# Patient Record
Sex: Female | Born: 1960 | Race: Asian | Hispanic: No | Marital: Married | Smoking: Never smoker
Health system: Southern US, Community
[De-identification: ages and names within clinical notes are randomized; demographics above are authoritative.]

## PROBLEM LIST (undated history)

## (undated) DIAGNOSIS — M5030 Other cervical disc degeneration, unspecified cervical region: Secondary | ICD-10-CM

## (undated) DIAGNOSIS — M5137 Other intervertebral disc degeneration, lumbosacral region: Secondary | ICD-10-CM

## (undated) DIAGNOSIS — M26609 Unspecified temporomandibular joint disorder, unspecified side: Secondary | ICD-10-CM

## (undated) DIAGNOSIS — G43909 Migraine, unspecified, not intractable, without status migrainosus: Secondary | ICD-10-CM

## (undated) DIAGNOSIS — IMO0001 Reserved for inherently not codable concepts without codable children: Secondary | ICD-10-CM

## (undated) DIAGNOSIS — G43919 Migraine, unspecified, intractable, without status migrainosus: Secondary | ICD-10-CM

## (undated) DIAGNOSIS — G8929 Other chronic pain: Secondary | ICD-10-CM

## (undated) DIAGNOSIS — R768 Other specified abnormal immunological findings in serum: Secondary | ICD-10-CM

## (undated) DIAGNOSIS — M2550 Pain in unspecified joint: Secondary | ICD-10-CM

## (undated) DIAGNOSIS — M5136 Other intervertebral disc degeneration, lumbar region: Secondary | ICD-10-CM

## (undated) DIAGNOSIS — G43709 Chronic migraine without aura, not intractable, without status migrainosus: Secondary | ICD-10-CM

## (undated) DIAGNOSIS — L309 Dermatitis, unspecified: Secondary | ICD-10-CM

## (undated) DIAGNOSIS — R9389 Abnormal findings on diagnostic imaging of other specified body structures: Secondary | ICD-10-CM

## (undated) DIAGNOSIS — Z8742 Personal history of other diseases of the female genital tract: Secondary | ICD-10-CM

## (undated) DIAGNOSIS — G4700 Insomnia, unspecified: Secondary | ICD-10-CM

## (undated) DIAGNOSIS — D649 Anemia, unspecified: Secondary | ICD-10-CM

## (undated) DIAGNOSIS — R87619 Unspecified abnormal cytological findings in specimens from cervix uteri: Secondary | ICD-10-CM

## (undated) DIAGNOSIS — N950 Postmenopausal bleeding: Secondary | ICD-10-CM

## (undated) DIAGNOSIS — M1990 Unspecified osteoarthritis, unspecified site: Secondary | ICD-10-CM

## (undated) DIAGNOSIS — M797 Fibromyalgia: Secondary | ICD-10-CM

## (undated) DIAGNOSIS — E039 Hypothyroidism, unspecified: Secondary | ICD-10-CM

## (undated) DIAGNOSIS — K5909 Other constipation: Secondary | ICD-10-CM

## (undated) HISTORY — DX: Anemia, unspecified: D64.9

## (undated) HISTORY — DX: Unspecified abnormal cytological findings in specimens from cervix uteri: R87.619

## (undated) HISTORY — PX: BREAST ENHANCEMENT SURGERY: SHX7

## (undated) HISTORY — PX: TUBAL LIGATION: SHX77

## (undated) HISTORY — PX: ANTERIOR FUSION CERVICAL SPINE: SUR626

## (undated) HISTORY — DX: Hypothyroidism, unspecified: E03.9

## (undated) HISTORY — DX: Other intervertebral disc degeneration, lumbosacral region: M51.37

## (undated) HISTORY — DX: Migraine, unspecified, not intractable, without status migrainosus: G43.909

## (undated) HISTORY — DX: Reserved for inherently not codable concepts without codable children: IMO0001

## (undated) HISTORY — PX: EMMI , ANTERIOR CERVICAL DISCECTOMY AND FUSION: 9988856

## (undated) HISTORY — DX: Unspecified temporomandibular joint disorder, unspecified side: M26.609

## (undated) HISTORY — DX: Migraine, unspecified, intractable, without status migrainosus: G43.919

## (undated) HISTORY — DX: Other cervical disc degeneration, unspecified cervical region: M50.30

## (undated) MED ORDER — BOTOX 200 UNITS IJ SOLR
INTRAMUSCULAR | Status: AC
Start: 2016-08-14 — End: ?

## (undated) MED ORDER — BOTULINUM TOXIN TYPE A 100 UNIT IM SOLR
100.00 [IU] | Freq: Once | INTRAMUSCULAR | Status: AC
Start: 2014-07-20 — End: 2014-07-20

---

## 2005-03-19 ENCOUNTER — Emergency Department (HOSPITAL_COMMUNITY): Admission: EM | Admit: 2005-03-19 | Discharge: 2005-03-20 | Payer: Self-pay | Admitting: Emergency Medicine

## 2006-04-10 ENCOUNTER — Emergency Department (HOSPITAL_COMMUNITY): Admission: EM | Admit: 2006-04-10 | Discharge: 2006-04-11 | Payer: Self-pay | Admitting: Emergency Medicine

## 2006-04-16 ENCOUNTER — Ambulatory Visit (HOSPITAL_BASED_OUTPATIENT_CLINIC_OR_DEPARTMENT_OTHER): Admission: RE | Admit: 2006-04-16 | Discharge: 2006-04-16 | Payer: Self-pay | Admitting: Plastic Surgery

## 2006-04-16 ENCOUNTER — Encounter (INDEPENDENT_AMBULATORY_CARE_PROVIDER_SITE_OTHER): Payer: Self-pay | Admitting: Specialist

## 2006-06-22 ENCOUNTER — Encounter: Admission: RE | Admit: 2006-06-22 | Discharge: 2006-06-22 | Payer: Self-pay | Admitting: Orthopedic Surgery

## 2006-07-08 ENCOUNTER — Emergency Department (HOSPITAL_COMMUNITY): Admission: EM | Admit: 2006-07-08 | Discharge: 2006-07-08 | Payer: Self-pay | Admitting: Emergency Medicine

## 2007-08-18 ENCOUNTER — Encounter: Admission: RE | Admit: 2007-08-18 | Discharge: 2007-08-18 | Payer: Self-pay | Admitting: Neurological Surgery

## 2007-12-04 ENCOUNTER — Encounter: Admission: RE | Admit: 2007-12-04 | Discharge: 2007-12-04 | Payer: Self-pay | Admitting: Gastroenterology

## 2008-07-07 IMAGING — US US ABDOMEN COMPLETE
1 series · 14 of 25 positions shown · non-contrast
Comparison: None

CLINICAL DATA: Epigastric pain

ABDOMEN ULTRASOUND
TECHNIQUE: Complete abdominal ultrasound examination was performed
including evaluation of the liver, gallbladder, bile ducts,
pancreas, kidneys, spleen, IVC, and abdominal aorta.

[Series 1: us abdomen complete · 0.26mm/px · 14 of 83 slices shown]
[im 1/83]
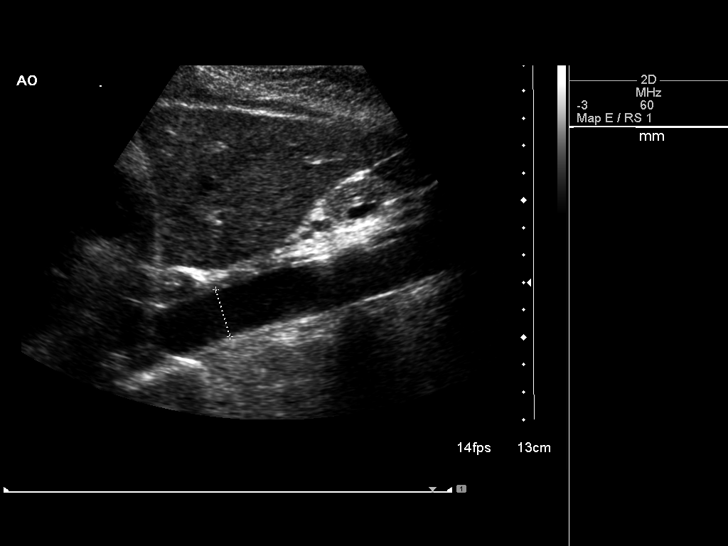
[im 7/83]
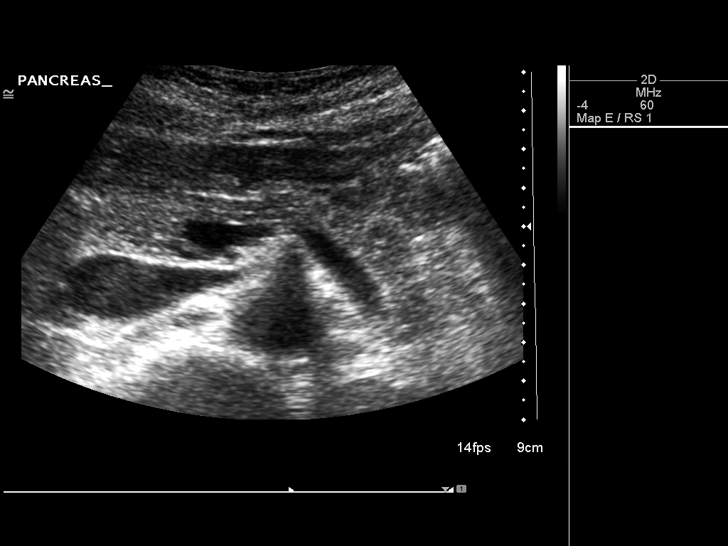
[im 14/83]
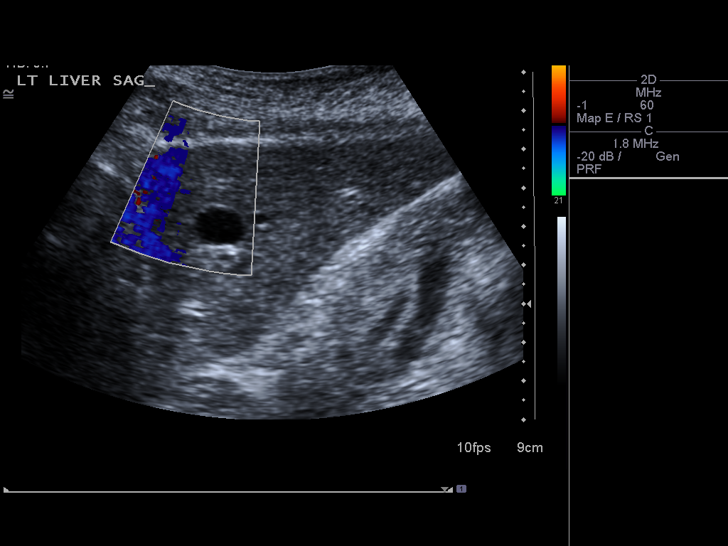
[im 21/83]
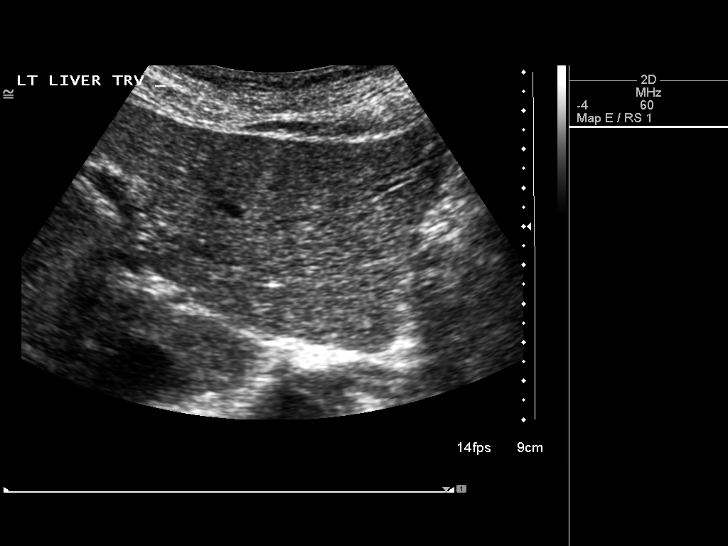
[im 28/83]
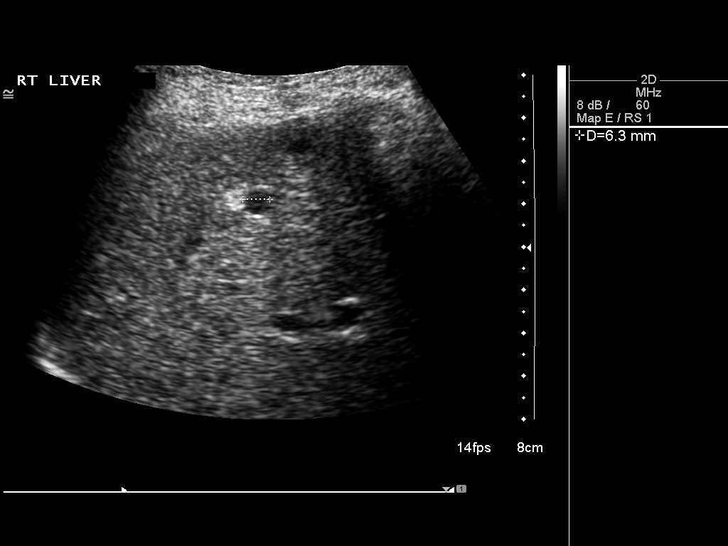
[im 31/83]
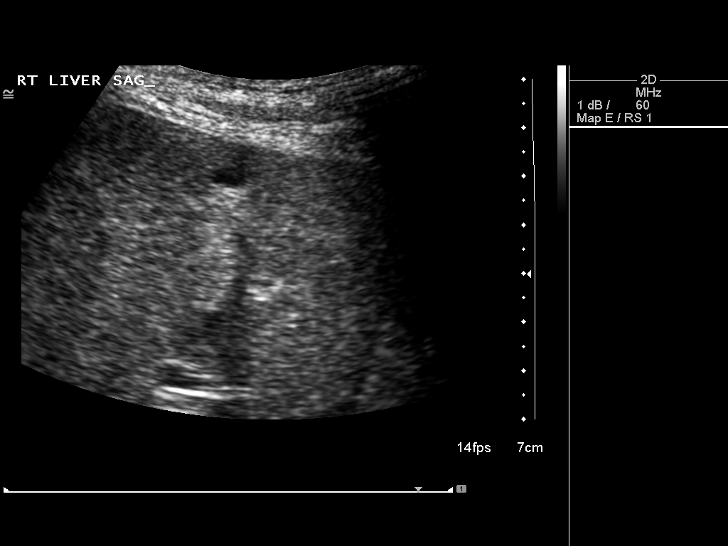
[im 38/83]
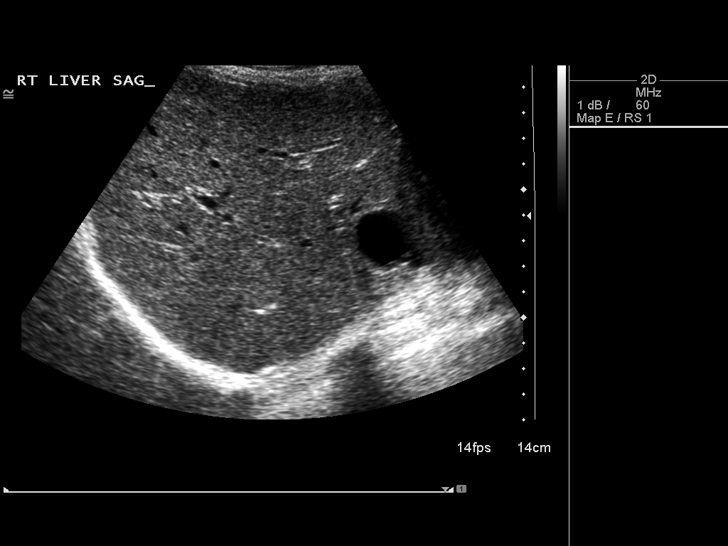
[im 45/83]
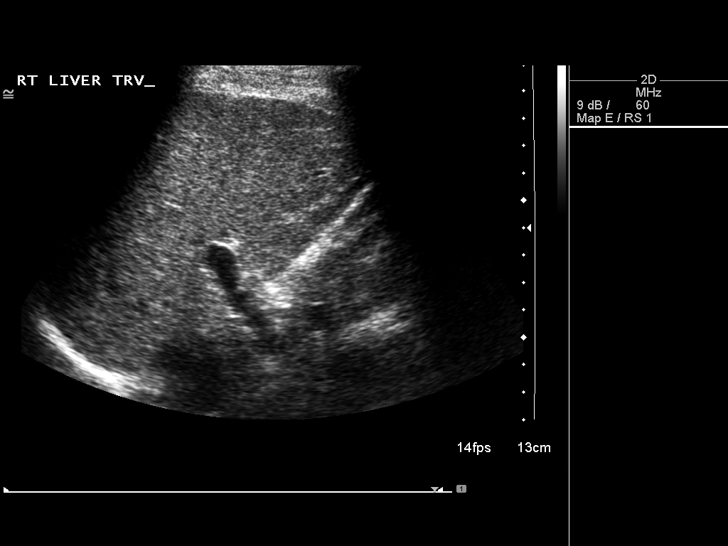
[im 52/83]
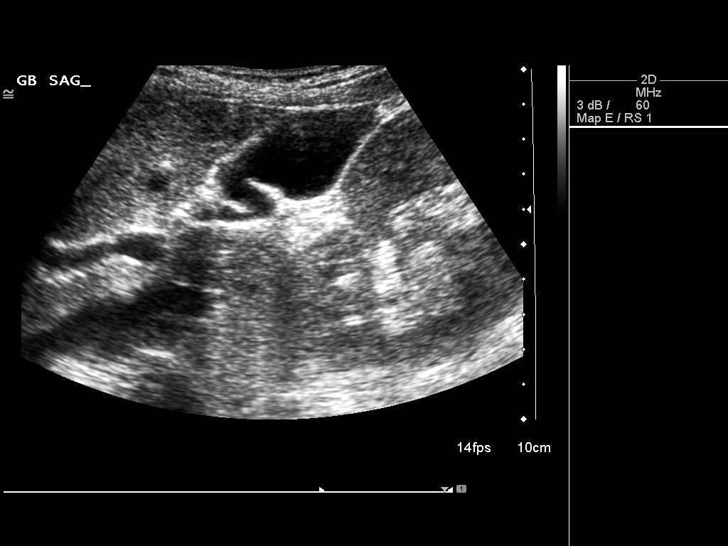
[im 55/83]
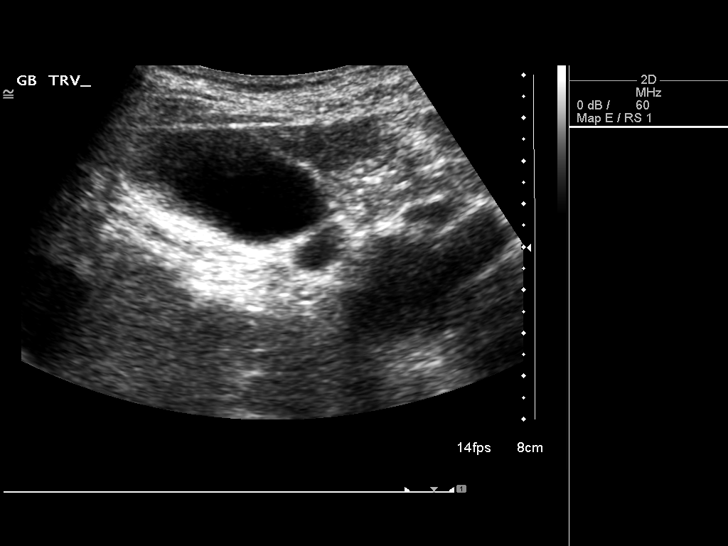
[im 62/83]
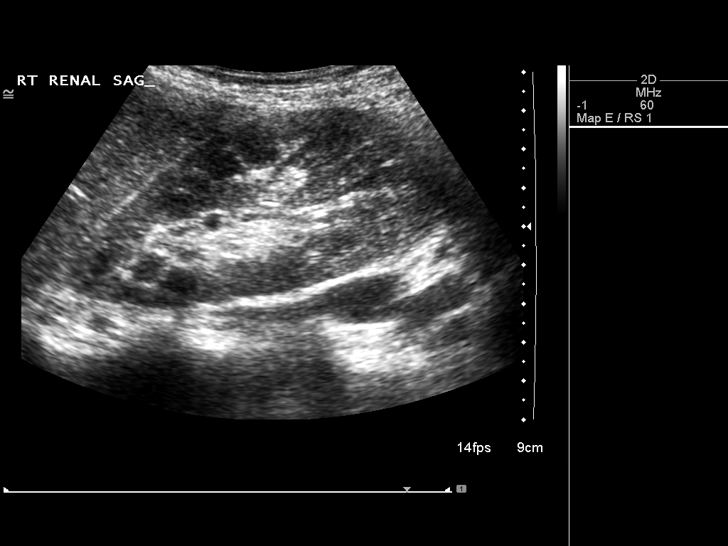
[im 69/83]
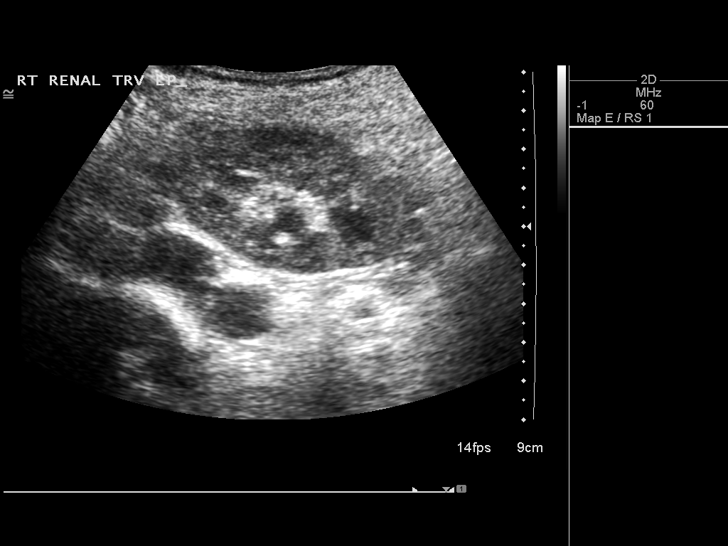
[im 76/83]
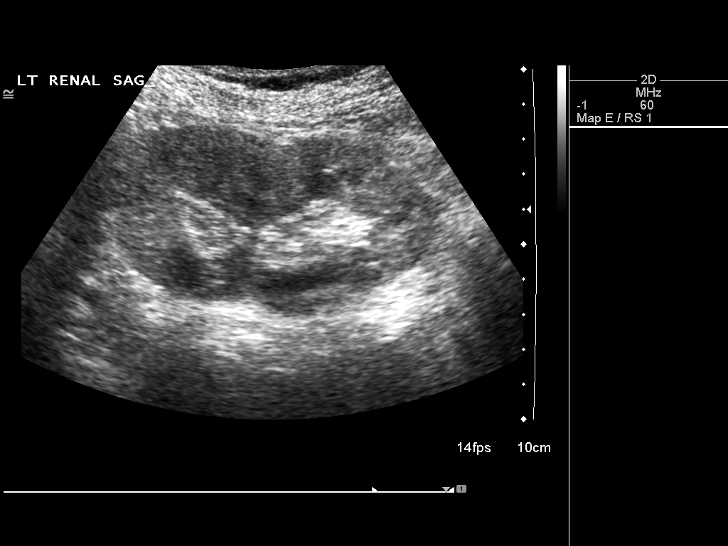
[im 83/83]
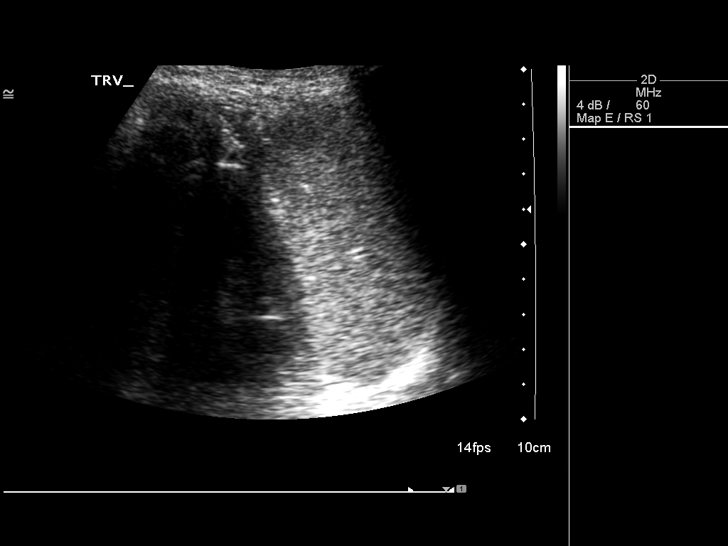

[14 of 25 positions shown; findings below may reference images not displayed]

FINDINGS: The gallbladder is well seen and no gallstones are noted.
The liver has a normal echogenic pattern, but there are multiple
liver cysts present. There are least seven liver cysts noted, with
five in the right lobe and two in the left lobe.  The largest cyst
is in the right lobe measuring 3.3 cm in diameter.  No solid
hepatic lesion is seen and no ductal dilatation is noted.  The
common bile duct is normal measuring 3.5 mm in diameter.  The IVC,
pancreas, and spleen appear normal.  No hydronephrosis is noted.
The right kidney measures 9.6 cm sagittally with left kidney
measuring 10.7 cm.  The abdominal aorta is normal in caliber.
IMPRESSION: 1.  No gallstones.  No ductal dilatation.
2.  Multiple liver cysts as noted above.

## 2009-05-19 HISTORY — PX: ANTERIOR CERVICAL DECOMP/DISCECTOMY FUSION: SHX1161

## 2009-08-20 HISTORY — PX: MICROLARYNGOSCOPY: SHX5208

## 2010-08-14 ENCOUNTER — Encounter: Payer: Self-pay | Admitting: Orthopedic Surgery

## 2010-08-14 ENCOUNTER — Encounter: Payer: Self-pay | Admitting: Family Medicine

## 2010-10-10 ENCOUNTER — Other Ambulatory Visit: Payer: Self-pay | Admitting: Obstetrics and Gynecology

## 2010-11-02 ENCOUNTER — Other Ambulatory Visit: Payer: Self-pay | Admitting: Obstetrics and Gynecology

## 2010-12-09 NOTE — Op Note (Signed)
[name], Kaitlin Moses              ACCOUNT NO.:  [id number]   MEDICAL RECORD NO.:  [id number]          PATIENT TYPE:  AMB   LOCATION:  [facility]                          FACILITY:  [facility]   PHYSICIAN:  Alfredia Ferguson, M.D.  DATE OF BIRTH:  February 16, 1961   DATE OF PROCEDURE:  04/16/2006  DATE OF DISCHARGE:                                 OPERATIVE REPORT   PREOPERATIVE DIAGNOSIS:  A 1 cm pigmented nevus right pretibial area and mid  leg.   POSTOPERATIVE DIAGNOSIS:  A 1 cm pigmented nevus right pretibial area and  mid leg.   OPERATION PERFORMED:  Excision of pretibial pigmented nevus with  approximately 2 mm margins.   SURGEON:  Alfredia Ferguson, M.D.   ANESTHESIA:  2% Xylocaine with 1:100,000 epinephrine.   INDICATIONS FOR SURGERY:  50 year old [place] female with a darkly pigmented  nevus approximately 1 cm in diameter.  The patient understands that excision  of this area is going to leave a scar due to the tension of the closure.  In  spite of that, she wishes to proceed.  She was offered the opportunity of  simply doing a biopsy to see what the lesion represented, but she opted to  proceed with excision.   DESCRIPTION OF SURGERY:  Skin marks were placed in an elliptical fashion and  then local anesthesia was infiltrated.  The right pretibial area was prepped  with Betadine and draped with sterile drapes.  An elliptical excision down  to the subcutaneous tissue was carried out.  The specimen was submitted for  pathology.  The wound edges were undermined for a distance of several mm in  all directions.  The wound was closed reapproximating the dermis with  interrupted 4-0 Monocryl sutures.  The skin was united with interrupted 5-0  nylon sutures.  The patient tolerated the procedure well with minimal blood  loss.  The area was cleansed and dried and a light dressing was applied.  The patient was discharged home in satisfactory addition.      Alfredia Ferguson, M.D.  Electronically  Signed     WBB/MEDQ  D:  04/16/2006  T:  04/17/2006  Job:  [id number]

## 2011-01-11 ENCOUNTER — Encounter: Payer: Self-pay | Admitting: Oncology

## 2011-01-13 ENCOUNTER — Other Ambulatory Visit: Payer: Self-pay | Admitting: Oncology

## 2011-01-13 ENCOUNTER — Encounter (HOSPITAL_BASED_OUTPATIENT_CLINIC_OR_DEPARTMENT_OTHER): Payer: BC Managed Care – PPO | Admitting: Oncology

## 2011-01-13 DIAGNOSIS — E039 Hypothyroidism, unspecified: Secondary | ICD-10-CM

## 2011-01-13 DIAGNOSIS — D649 Anemia, unspecified: Secondary | ICD-10-CM

## 2011-01-13 LAB — CBC WITH DIFFERENTIAL/PLATELET
BASO%: 0.6 % (ref 0.0–2.0)
Basophils Absolute: 0 10*3/uL (ref 0.0–0.1)
EOS%: 2.9 % (ref 0.0–7.0)
Eosinophils Absolute: 0.2 10*3/uL (ref 0.0–0.5)
HCT: 32.1 % — ABNORMAL LOW (ref 34.8–46.6)
HGB: 10.9 g/dL — ABNORMAL LOW (ref 11.6–15.9)
LYMPH%: 28.1 % (ref 14.0–49.7)
MCH: 31.9 pg (ref 25.1–34.0)
MCHC: 33.9 g/dL (ref 31.5–36.0)
MCV: 94.1 fL (ref 79.5–101.0)
MONO#: 0.5 10*3/uL (ref 0.1–0.9)
MONO%: 8.6 % (ref 0.0–14.0)
NEUT#: 3.5 10*3/uL (ref 1.5–6.5)
NEUT%: 59.8 % (ref 38.4–76.8)
Platelets: 204 10*3/uL (ref 145–400)
RBC: 3.41 10*6/uL — ABNORMAL LOW (ref 3.70–5.45)
RDW: 14.2 % (ref 11.2–14.5)
WBC: 5.8 10*3/uL (ref 3.9–10.3)
lymph#: 1.6 10*3/uL (ref 0.9–3.3)

## 2011-01-13 LAB — MORPHOLOGY: PLT EST: ADEQUATE

## 2011-01-13 LAB — CHCC SMEAR

## 2011-01-17 LAB — PROTEIN ELECTROPHORESIS, SERUM
Albumin ELP: 62.5 % (ref 55.8–66.1)
Alpha-1-Globulin: 3.8 % (ref 2.9–4.9)
Alpha-2-Globulin: 9.1 % (ref 7.1–11.8)
Beta 2: 4.5 % (ref 3.2–6.5)
Beta Globulin: 5.7 % (ref 4.7–7.2)
Gamma Globulin: 14.4 % (ref 11.1–18.8)
Total Protein, Serum Electrophoresis: 6.1 g/dL (ref 6.0–8.3)

## 2011-01-17 LAB — FERRITIN: Ferritin: 23 ng/mL (ref 10–291)

## 2011-01-17 LAB — IRON AND TIBC
%SAT: 23 % (ref 20–55)
Iron: 77 ug/dL (ref 42–145)
TIBC: 328 ug/dL (ref 250–470)
UIBC: 251 ug/dL

## 2011-01-17 LAB — VITAMIN B12: Vitamin B-12: 813 pg/mL (ref 211–911)

## 2011-01-17 LAB — COMPREHENSIVE METABOLIC PANEL
ALT: 13 U/L (ref 0–35)
AST: 20 U/L (ref 0–37)
Albumin: 4.1 g/dL (ref 3.5–5.2)
Alkaline Phosphatase: 33 U/L — ABNORMAL LOW (ref 39–117)
BUN: 13 mg/dL (ref 6–23)
CO2: 27 mEq/L (ref 19–32)
Calcium: 9.2 mg/dL (ref 8.4–10.5)
Chloride: 103 mEq/L (ref 96–112)
Creatinine, Ser: 0.79 mg/dL (ref 0.50–1.10)
Glucose, Bld: 61 mg/dL — ABNORMAL LOW (ref 70–99)
Potassium: 4.1 mEq/L (ref 3.5–5.3)
Sodium: 137 mEq/L (ref 135–145)
Total Bilirubin: 0.6 mg/dL (ref 0.3–1.2)
Total Protein: 6.1 g/dL (ref 6.0–8.3)

## 2011-01-17 LAB — LACTATE DEHYDROGENASE: LDH: 131 U/L (ref 94–250)

## 2011-01-17 LAB — T4, FREE: Free T4: 1.12 ng/dL (ref 0.80–1.80)

## 2011-01-17 LAB — TSH: TSH: 5.176 u[IU]/mL — ABNORMAL HIGH (ref 0.350–4.500)

## 2011-01-17 LAB — HAPTOGLOBIN: Haptoglobin: 83 mg/dL (ref 30–200)

## 2011-01-17 LAB — FOLATE: Folate: >20 ng/mL

## 2011-06-29 ENCOUNTER — Encounter: Payer: Self-pay | Admitting: *Deleted

## 2011-07-02 ENCOUNTER — Encounter: Payer: Self-pay | Admitting: Oncology

## 2011-07-02 DIAGNOSIS — E039 Hypothyroidism, unspecified: Secondary | ICD-10-CM | POA: Insufficient documentation

## 2011-07-02 DIAGNOSIS — D649 Anemia, unspecified: Secondary | ICD-10-CM | POA: Insufficient documentation

## 2011-07-10 ENCOUNTER — Ambulatory Visit: Payer: BC Managed Care – PPO | Admitting: Oncology

## 2011-07-10 ENCOUNTER — Other Ambulatory Visit: Payer: BC Managed Care – PPO | Admitting: Lab

## 2011-08-19 ENCOUNTER — Telehealth: Payer: Self-pay | Admitting: Oncology

## 2011-08-19 NOTE — Telephone Encounter (Signed)
l/m and mailed appt info from 01/13/11 pof   aom

## 2011-10-17 ENCOUNTER — Other Ambulatory Visit: Payer: BC Managed Care – PPO | Admitting: Lab

## 2012-01-29 ENCOUNTER — Other Ambulatory Visit: Payer: BC Managed Care – PPO

## 2012-01-29 ENCOUNTER — Ambulatory Visit: Payer: BC Managed Care – PPO | Admitting: Oncology

## 2012-04-11 ENCOUNTER — Other Ambulatory Visit: Payer: Self-pay | Admitting: Obstetrics and Gynecology

## 2013-07-14 ENCOUNTER — Ambulatory Visit (INDEPENDENT_AMBULATORY_CARE_PROVIDER_SITE_OTHER): Payer: Commercial Managed Care - PPO | Admitting: Neurology

## 2013-07-14 ENCOUNTER — Encounter (INDEPENDENT_AMBULATORY_CARE_PROVIDER_SITE_OTHER): Payer: Self-pay | Admitting: Neurology

## 2013-07-14 ENCOUNTER — Encounter (INDEPENDENT_AMBULATORY_CARE_PROVIDER_SITE_OTHER): Payer: Commercial Managed Care - PPO | Admitting: Neurology

## 2013-07-14 VITALS — BP 107/72 | HR 60 | Temp 97.5°F | Resp 18

## 2013-07-14 DIAGNOSIS — G43819 Other migraine, intractable, without status migrainosus: Secondary | ICD-10-CM

## 2013-07-14 DIAGNOSIS — G43919 Migraine, unspecified, intractable, without status migrainosus: Secondary | ICD-10-CM | POA: Insufficient documentation

## 2013-07-14 DIAGNOSIS — M26609 Unspecified temporomandibular joint disorder, unspecified side: Secondary | ICD-10-CM

## 2013-07-14 MED ORDER — ZOLPIDEM TARTRATE 5 MG OR TABS
5.00 mg | ORAL_TABLET | Freq: Every evening | ORAL | Status: DC | PRN
Start: ? — End: 2015-08-23

## 2013-07-14 MED ORDER — BOTULINUM TOXIN TYPE A 100 UNIT IM SOLR
100.00 [IU] | Freq: Once | INTRAMUSCULAR | Status: AC
Start: 2013-07-14 — End: 2013-07-14

## 2013-07-14 MED ORDER — HYDROCODONE-ACETAMINOPHEN 10-325 MG OR TABS
1.00 | ORAL_TABLET | Freq: Four times a day (QID) | ORAL | Status: DC | PRN
Start: ? — End: 2015-08-23

## 2013-07-14 NOTE — Addendum Note (Signed)
 Addended by: Kiarrah Rausch on: 07/14/2013 04:31 PM     Modules accepted: Level of Service

## 2013-07-14 NOTE — Progress Notes (Signed)
 BOTULINUM TOXIN INJECTION VISIT    NAME:  Kathy Mckenzie  DOB:  03-Nov-1960  MRN:  [id number]  DATE OF EVALUATION:  07/14/2013    REASON FOR VISIT:  Botulinum toxin injection(s)    HISTORY:  Kathy Mckenzie is a 52 year old-year old female with chronic migraine last seen by me in my previous clinic in [place] having received Botox  on 10/2.14.   Botulinum toxin therapy has improved her headaches significantly. Of note, she has also noted gluten free diet has helped.     OUTCOMES  BENEFITS ONSET OF EFFECT ADVERSE EFFECTS Type:   Onset 1-2 weeks Onset Drooping left brow   Maximal Effect 6 Maximal Effect    Total Duration 8 Total Duration 4 weeks   % Improvement >50%       CURRENT MEDICATIONS  Current Outpatient Prescriptions   Medication Sig   . HYDROcodone -acetaminophen  (NORCO) 10-325 MG tablet Take 1 tablet by mouth every 6 hours as needed for Moderate Pain (Pain Score 4-6).   . zolpidem  (AMBIEN ) 5 MG tablet Take 5 mg by mouth nightly as needed for Insomnia.     No current facility-administered medications for this visit.       NEUROLOGICAL EXAMINATION  Vitals:  BP 107/72  Pulse 60  Temp(Src) 97.5 F (36.4 C) (Oral)  Resp 18  SpO2 98%  LMP 06/30/2013    PERRL< normal facial symmetry and conversational hearing.   No dysarthria.   Normal bulk and tone, strength 5/5  DTRs 2+ BUE and BLE  Sensory normal to all modalities   Normal FFM  Normal gait    BOTULINUM TOXIN PROCEDURE NOTE      Areas to be injected were prepped with isopropyl alcohol.      INJECTION LOG   MUSCLE SIDE DILUTION #SITES UNITS PRIOR DOSE   1. Procerus - 2:1 1 5 5    2. Corrugators B  1 each 2.5 2.5   3. Frontalis B  4 2.5 each 5 each   4. Temporalis B  1 each 5 each 10 each   5. Supra-auricular B  1 each 5 each 5   6. Base of skull B  1 each 10 each 10   7. CxPS B  1 each 5 each 5   8. Trapezius B  1 each 5 each 5      TOTAL DOSE: 100 WASTE: 0   Also       Orb oculi lat inf      B                          1 each   1.25 each     None       Masseter                     B                           1 each    5 each        None    Toxin Brand Utilized: Botox  [employer] Toxin Supply Used.    The patient tolerated the procedure well with no adverse effects.    Lot No: [id number]  Exp:  March 2017      ASSESSMENT AND PLAN  1.  Chronic Refractory migraine  - Based on the signs and symptoms consistent with this disorder, botulinum toxin therapy is necessary  and consistent with generally accepted professional standards within the medical literature and provided at safe and effective dosages.    2. Temporomandibular joint disease - patient reports pain in B masseters.    RTC in 12 weeks.     TOTAL TIME SPENT WITH PATIENT: 

## 2013-07-21 ENCOUNTER — Encounter (INDEPENDENT_AMBULATORY_CARE_PROVIDER_SITE_OTHER): Payer: Medicare PPO | Admitting: Neurology

## 2013-10-08 ENCOUNTER — Ambulatory Visit (INDEPENDENT_AMBULATORY_CARE_PROVIDER_SITE_OTHER): Payer: BC Managed Care – PPO | Admitting: Neurology

## 2013-10-08 ENCOUNTER — Encounter (INDEPENDENT_AMBULATORY_CARE_PROVIDER_SITE_OTHER): Payer: Self-pay | Admitting: Neurology

## 2013-10-08 VITALS — BP 86/51 | HR 61 | Temp 97.6°F | Resp 17

## 2013-10-08 DIAGNOSIS — M26609 Unspecified temporomandibular joint disorder, unspecified side: Secondary | ICD-10-CM

## 2013-10-08 DIAGNOSIS — G43719 Chronic migraine without aura, intractable, without status migrainosus: Secondary | ICD-10-CM

## 2013-10-08 MED ORDER — GABAPENTIN PO
ORAL | Status: DC
Start: ? — End: 2015-08-23

## 2013-10-08 MED ORDER — BOTULINUM TOXIN TYPE A 100 UNIT IM SOLR
100.0000 [IU] | Freq: Once | INTRAMUSCULAR | Status: AC
Start: 2013-10-08 — End: 2013-10-09

## 2013-10-08 MED ORDER — BOTOX IJ
100.00 [IU] | INTRAMUSCULAR | Status: DC
Start: ? — End: 2015-08-23

## 2013-10-08 MED ORDER — DIAZEPAM PO
ORAL | Status: DC
Start: ? — End: 2015-08-23

## 2013-10-08 MED ORDER — LEVOTHYROXINE SODIUM 50 MCG OR TABS: 50.00 ug | ORAL_TABLET | Freq: Every day | ORAL | Status: AC

## 2013-10-08 NOTE — Progress Notes (Signed)
BOTULINUM TOXIN INJECTION VISIT    NAME:  Kathy Mckenzie  DOB:  04/18/1961  MRN:  [id number]  DATE OF EVALUATION:  10/08/2013    REASON FOR VISIT:  Botulinum toxin injection(s)    HISTORY:  Kathy HugeKyung Perrone is a 53 year old-year old female with chronic migraines last seen 07/14/13.  She is still having jaw pain that radiates into the neck.     OUTCOMES  BENEFITS ONSET OF EFFECT ADVERSE EFFECTS Type:     Onset 1 week Onset    Maximal Effect 1.5-2wks Maximal Effect    Total Duration 10weeks Total Duration    % Improvement 70-75%       CURRENT MEDICATIONS  Current Outpatient Prescriptions   Medication Sig    DIAZEPAM PO     GABAPENTIN PO     HYDROcodone-acetaminophen (NORCO) 10-325 MG tablet Take 1 tablet by mouth every 6 hours as needed for Moderate Pain (Pain Score 4-6).    levothyroxine (SYNTHROID) 50 MCG tablet Take 50 mcg by mouth daily.    OnabotulinumtoxinA (BOTOX IJ)     zolpidem (AMBIEN) 5 MG tablet Take 5 mg by mouth nightly as needed for Insomnia.     No current facility-administered medications for this visit.       NEUROLOGICAL EXAMINATION  Vitals:  BP 86/51    Pulse 61    Temp(Src) 97.6 F (36.4 C) (Oral)    Resp 17    SpO2 96%     No facial drooping.   Normal speech    BOTULINUM TOXIN PROCEDURE NOTE      Areas to be injected were prepped with isopropyl alcohol.      INJECTION LOG   MUSCLE  SIDE  DILUTION  #SITES  UNITS  PRIOR DOSE    1.  Procerus  -  2:1  1  5  5     2.  Corrugators  B   1 each  2.5  2.5    3.  Frontalis  B   4  2.5 each  5 each    4.  Temporalis  B   1 each  5 each  10 each    5.  Supra-auricular  B   1 each  5 each  5    6.  Base of skull  B   1 each  5 each  10    7.  CxPS  B   1 each  5 each  5    8.  Trapezius  B   1 each  5 each  5       TOTAL  DOSE:  100  WASTE: 0    Also  Orb oculi lat inf    B    1 each 1.25 each  None  Masseter    B    1 each 10 each  None  Posterior mastoid         B                                1           3.75 units None    Toxin Brand Utilized:   Botox  Core Toxin Supply Used.    Lot No: [id number]  Exp Date: March 2017    The patient tolerated the procedure well with no adverse effects.    ASSESSMENT AND PLAN  1. Chronic Refractory migraine  - Based  on the signs and symptoms consistent with this disorder, botulinum toxin therapy is necessary and consistent with generally accepted professional standards within the medical literature and provided at safe and effective dosages.   2. Temporomandibular joint disease - patient reports pain in B masseters.    RTC in 12 weeks.

## 2014-01-08 ENCOUNTER — Encounter (INDEPENDENT_AMBULATORY_CARE_PROVIDER_SITE_OTHER): Payer: BC Managed Care – PPO | Admitting: Neurology

## 2014-01-22 ENCOUNTER — Encounter (INDEPENDENT_AMBULATORY_CARE_PROVIDER_SITE_OTHER): Payer: Self-pay | Admitting: Neurology

## 2014-01-22 ENCOUNTER — Ambulatory Visit (INDEPENDENT_AMBULATORY_CARE_PROVIDER_SITE_OTHER): Payer: BC Managed Care – PPO | Admitting: Neurology

## 2014-01-22 VITALS — BP 92/51 | HR 67 | Temp 98.2°F | Resp 17 | Ht 63.0 in | Wt 120.0 lb

## 2014-01-22 DIAGNOSIS — G43719 Chronic migraine without aura, intractable, without status migrainosus: Secondary | ICD-10-CM

## 2014-01-22 MED ORDER — POLYETHYLENE GLYCOL 3350 OR POWD
ORAL | Status: DC
Start: 2014-01-20 — End: 2015-08-23

## 2014-01-22 MED ORDER — DIAZEPAM 5 MG OR TABS
ORAL_TABLET | ORAL | Status: DC
Start: 2014-01-05 — End: 2015-08-23

## 2014-01-22 MED ORDER — GABAPENTIN 300 MG OR CAPS
300.00 mg | ORAL_CAPSULE | Freq: Three times a day (TID) | ORAL | Status: DC
Start: 2014-01-05 — End: 2016-09-25

## 2014-01-22 NOTE — Progress Notes (Signed)
BOTULINUM TOXIN INJECTION VISIT    NAME:  Kathy Mckenzie Lusher  DOB:  03/15/1961  MRN:  [id number]  DATE OF EVALUATION:  01/22/2014    REASON FOR VISIT:  Botulinum toxin injection(s)    HISTORY:  Kathy Mckenzie Galli is a 53 year old-year old female with migraines    Effects started to taper off 3 weeks ago. Feeling worse over the past week. She is two weeks overdue due to my vacation.   The masseter injections helped with the jaw pain.     OUTCOMES  BENEFITS ONSET OF EFFECT ADVERSE EFFECTS Type:     Onset 1week Onset    Maximal Effect 1.5-2wks Maximal Effect    Total Duration 10-11weeks Total Duration    % Improvement        CURRENT MEDICATIONS  Current Outpatient Prescriptions   Medication Sig    diazepam (VALIUM) 5 MG tablet     DIAZEPAM PO     gabapentin (NEURONTIN) 300 MG capsule     GABAPENTIN PO     HYDROcodone-acetaminophen (NORCO) 10-325 MG tablet Take 1 tablet by mouth every 6 hours as needed for Moderate Pain (Pain Score 4-6).    levothyroxine (SYNTHROID) 50 MCG tablet Take 50 mcg by mouth daily.    OnabotulinumtoxinA (BOTOX IJ) Inject 100 Units under the skin Every 3 months.    polyethylene glycol (GLYCOLAX) powder     zolpidem (AMBIEN) 5 MG tablet Take 5 mg by mouth nightly as needed for Insomnia.     No current facility-administered medications for this visit.       NEUROLOGICAL EXAMINATION  Vitals:  BP 92/51   Pulse 67   Temp(Src) 98.2 F (36.8 C) (Oral)   Resp 17   Ht 5\' 3"  (1.6 m)   Wt 54.432 kg (120 lb)   BMI 21.26 kg/m2   SpO2 96%    No facial drooping.   Normal speech    BOTULINUM TOXIN PROCEDURE NOTE    Areas to be injected were prepped with isopropyl alcohol.    INJECTION LOG   MUSCLE   SIDE   DILUTION   #SITES   UNITS   PRIOR DOSE     1.  Procerus  -  2:1  1  5  5     2.  Corrugators  B   1 each  2.5  2.5    3.  Frontalis  B   4  2.5 each  5 each    4.  Temporalis  B   1 each  5 each  10 each    5.  Supra-auricular  B   1 each  5 each  5    6.  Base of skull  B   1 each  5 each  10    7.  CxPS  B   1  each  5 each  5    8.  Trapezius  B   1 each  5 each  5       TOTAL  DOSE:  100  WASTE: 0    Also  Orb oculi lat inf B     1 each 1.25 each 1.25 each  Masseter B      1 each 10 each    10  Posterior mastoid B     1  3.75 units 3.75    Toxin Brand Utilized: Botox [employer]oxin [employer] Used.    Lot No: [id number]  Exp Date: March 2017  The patient tolerated the procedure well with no adverse effects.    ASSESSMENT AND PLAN  1. Chronic Refractory migraine - Based on the signs and symptoms consistent with this disorder, botulinum toxin therapy is necessary and consistent with generally accepted professional standards within the medical literature and provided at safe and effective dosages.   2. Temporomandibular joint disease - patient reports pain in B masseters.        RTC in 12 weeks.

## 2014-02-06 MED ORDER — BOTULINUM TOXIN TYPE A 100 UNIT IM SOLR
100.00 [IU] | Freq: Once | INTRAMUSCULAR | Status: AC
Start: 2014-01-22 — End: 2014-01-23

## 2014-04-14 ENCOUNTER — Ambulatory Visit (INDEPENDENT_AMBULATORY_CARE_PROVIDER_SITE_OTHER): Payer: BC Managed Care – PPO | Admitting: Neurology

## 2014-04-14 VITALS — BP 111/68 | HR 69 | Resp 14

## 2014-04-14 DIAGNOSIS — G43909 Migraine, unspecified, not intractable, without status migrainosus: Principal | ICD-10-CM

## 2014-04-14 DIAGNOSIS — G43719 Chronic migraine without aura, intractable, without status migrainosus: Secondary | ICD-10-CM

## 2014-04-14 NOTE — Progress Notes (Signed)
-----(data below generated by Iver Nestle, MD)----    NEUROLOGY NOTE     Demographics:  Date: April 14, 2014   Patient Name: Kathy Mckenzie  Medical Record #: [id number]   DOB: 11/15/1960  Age: 53 year old  Sex: female      Clinic Location:      [facility]  [facility]  [address]  [place] [address]      Chief Complaint   Patient presents with    Headache     daily headaches,       History of Present Illness:       Kathy Mckenzie is a 53 year old female who is here for follow up. At that last visit, she had Botox injections.      She has been having HA for about one month. The headaches recurred about one month after the botox. However, they are much less severe, and she only needs Naprosyn about 1-2x/week. She has been having lots of back pain, that goes up to the shoulders and neck, to the back of the head and the bilateral jaw pain. She is pending ESI lumbar spine tomorrow.      Allergies:   Allergies   Allergen Reactions    Peanuts [Peanut Oil] Rash       Current Outpatient Prescriptions   Medication Sig    diazepam (VALIUM) 5 MG tablet     DIAZEPAM PO     gabapentin (NEURONTIN) 300 MG capsule 300 mg 2 times daily.    GABAPENTIN PO     HYDROcodone-acetaminophen (NORCO) 10-325 MG tablet Take 1 tablet by mouth every 6 hours as needed for Moderate Pain (Pain Score 4-6).    levothyroxine (SYNTHROID) 50 MCG tablet Take 50 mcg by mouth daily.    OnabotulinumtoxinA (BOTOX IJ) Inject 100 Units under the skin Every 3 months.    polyethylene glycol (GLYCOLAX) powder     zolpidem (AMBIEN) 5 MG tablet Take 5 mg by mouth nightly as needed for Insomnia.     No current facility-administered medications for this visit.       Past Medical History   Diagnosis Date    Refractory migraine     Temporomandibular joint disorders, unspecified     Myalgia and myositis, unspecified      Fibromyalgia    Headache(784.0)      Cervicogenic    Degeneration of cervical  intervertebral disc     Degeneration of lumbar or lumbosacral intervertebral disc        Past Surgical History   Procedure Laterality Date    Emmi , anterior cervical discectomy and fusion       C5-6       Family History:    Family History   Problem Relation Age of Onset    Family history unknown: Yes       History     Social History    Marital Status: Married     Spouse Name: N/A     Number of Children: N/A    Years of Education: N/A     Social History Main Topics    Smoking status: Never Smoker     Smokeless tobacco: Never Used    Alcohol Use: No    Drug Use: Not on file    Sexual Activity: Not on file     Other Topics Concern    Not on file     Social History Narrative  Other Social History (in addition to above): Companion (with patient): husband    Review of Systems: No fever/ chills/ nausea/ vomiting. No chest pain/ shortness of breath.    Exam:   On Exam today, I find:   Vital signs: Recorded in chart, and reviewed by me today.  BP 111/68 mmHg   Pulse 69   Resp 14   SpO2 94%     General Exam:  General: Patient is well developed, & in no acute distress.  Extremities: No clubbing, cyanosis, or edema.    Neuro Exam:  HIF: Oriented x 4. Intact memory. Appropriate mood & affect. Normal attention span & concentration. Normal language functions. Normal fund of knowledge. No neglect or inattention.   CN: Cranial Nerves II-XII intact throughout.   Motor: Normal bulk, tone and 5/5 strength in 4 extremities.   Sensory: Intact to all modalities.   DTR's: Symmetric throughout.   Cereb: Normal rapid alternating movements bilaterally.  Gait: Normal.    Labs/Imaging/ Studies:       RELEVANT RESULTS/ENCOUNTERS (in visit navigator) were reviewed: Yes  Radiographic film images were personally reviewed by me: N/A  Outside medical records were personally reviewed by me: N/A    Other Imaging Results: none      Areas to be injected were prepped with isopropyl alcohol.   INJECTION LOG   MUSCLE   SIDE   DILUTION   #SITES    UNITS   PRIOR DOSE     1.  Procerus  -  2:1  1  5  5     2.  Corrugators  B   1 each  2.5  2.5    3.  Frontalis  B   4  2.5 each  5 each    4.  Temporalis  B   1 each  5 each  10 each    5.  Supra-auricular  B   1 each  5 each  5    6.  Base of skull  B   1 each  5 each  10    7.  CxPS  B   1 each  5 each  5    8.  Trapezius  B   1 each  5 each  5       TOTAL  DOSE:  100  WASTE: 0    Also    Masseter B 1 each 10 each 10  Posterior mastoid B 1 5 units 3.75    Toxin Brand Utilized: Botox [employer]oxin [employer] Used.    Lot No: [id number]  Exp Date: March 2017        The patient tolerated the procedure well with no adverse effects.    ASSESSMENT AND PLAN  1. Chronic Refractory migraine - Based on the signs and symptoms consistent with this disorder, botulinum toxin therapy is necessary and consistent with generally accepted professional standards within the medical literature and provided at safe and effective dosages.   2. Temporomandibular joint disease - patient reports pain in B masseters.      Note Author: Iver Nestle, MD

## 2014-04-15 MED ORDER — BOTULINUM TOXIN TYPE A 100 UNIT IM SOLR
100.0000 [IU] | Freq: Once | INTRAMUSCULAR | Status: AC
Start: 2014-04-15 — End: 2014-04-14
  Administered 2014-04-14: 100 [IU] via INTRAMUSCULAR

## 2014-04-16 ENCOUNTER — Encounter (INDEPENDENT_AMBULATORY_CARE_PROVIDER_SITE_OTHER): Payer: BC Managed Care – PPO | Admitting: Neurology

## 2014-07-20 ENCOUNTER — Ambulatory Visit (INDEPENDENT_AMBULATORY_CARE_PROVIDER_SITE_OTHER): Payer: BC Managed Care – PPO | Admitting: Neurology

## 2014-07-20 VITALS — BP 86/65 | HR 84 | Temp 98.2°F | Resp 16 | Ht 63.0 in | Wt 120.0 lb

## 2014-07-20 DIAGNOSIS — G43719 Chronic migraine without aura, intractable, without status migrainosus: Secondary | ICD-10-CM

## 2014-07-20 MED ORDER — BOTULINUM TOXIN TYPE A 100 UNIT IM SOLR
100.00 [IU] | Freq: Once | INTRAMUSCULAR | Status: AC
Start: 2014-07-20 — End: 2014-07-20
  Administered 2014-07-20: 100 [IU] via INTRAMUSCULAR

## 2014-07-20 MED ORDER — RELISTOR 12 MG/0.6ML SC SOLN
SUBCUTANEOUS | Status: DC
Start: 2014-05-11 — End: 2015-08-23

## 2014-07-20 MED ORDER — VITAMIN D (ERGOCALCIFEROL) 1.25 MG (50000 UT) PO CAPS
ORAL_CAPSULE | ORAL | Status: DC
Start: 2014-04-23 — End: 2015-08-23

## 2014-07-20 NOTE — Progress Notes (Addendum)
BOTULINUM TOXIN INJECTION VISIT    NAME:  Kathy Mckenzie  DOB:  04/05/1961  MRN:  [id number]  DATE OF EVALUATION:  07/20/2014    REASON FOR VISIT:  Botulinum toxin injection(s)    HISTORY:  Kathy Mckenzie is a 53 year old-year old female with chronic migraine last seen 04/14/14. Botulinum toxin therapy has improved her headaches and quality of life significantly     Headaches restarted about 1.5 weeks ago.   In between, the headaches are well controlled.       CURRENT MEDICATIONS  Current Outpatient Prescriptions   Medication Sig    diazepam (VALIUM) 5 MG tablet     DIAZEPAM PO     ergocalciferol (VITAMIN D) 50000 UNIT capsule     gabapentin (NEURONTIN) 300 MG capsule 300 mg 2 times daily.    GABAPENTIN PO     HYDROcodone-acetaminophen (NORCO) 10-325 MG tablet Take 1 tablet by mouth every 6 hours as needed for Moderate Pain (Pain Score 4-6).    levothyroxine (SYNTHROID) 50 MCG tablet Take 50 mcg by mouth daily.    OnabotulinumtoxinA (BOTOX IJ) Inject 100 Units under the skin Every 3 months.    polyethylene glycol (GLYCOLAX) powder     RELISTOR 12 MG/0.6ML SOLN     zolpidem (AMBIEN) 5 MG tablet Take 5 mg by mouth nightly as needed for Insomnia.     Current Facility-Administered Medications   Medication    onabotulinumtoxin A (BOTOX) injection 100 Units       NEUROLOGICAL EXAMINATION  Vitals:  BP 86/65 mmHg   Pulse 84   Temp(Src) 98.2 F (36.8 C) (Oral)   Resp 16   Ht 5\' 3"  (1.6 m)   Wt 54.432 kg (120 lb)   BMI 21.26 kg/m2   SpO2 95%      Normal CN examination     BOTULINUM TOXIN PROCEDURE NOTE  Patient received Risk Evaluation and Mitigation Strategy Information and all questions were address.    Areas to be injected were prepped with isopropyl alcohol.   INJECTION LOG   MUSCLE   SIDE   DILUTION   #SITES   UNITS   PRIOR DOSE     1.  Procerus  -  2:1  1  5  5     2.  Corrugators  B   1 each  2.5  2.5    3.  Frontalis  B   4  2.5 each  5 each    4.  Temporalis  B   1 each  5 each  10 each    5.   Supra-auricular  B   1 each  5 each  5    6.  Base of skull  B   1 each  5 each  10    7.  CxPS  B   1 each  5 each  5    8.  Trapezius  B   1 each  5 each  5       TOTAL  DOSE:  100  WASTE: 0    Also    Masseter B 1 each 10 each 10  Posterior mastoid B 1 5 units 3.75    Toxin Brand Utilized: Botox Core Toxin Supply Used.         ASSESSMENT AND PLAN  Kathy Mckenzie was seen today for headache.    Diagnoses and associated orders for this visit:    Intractable chronic migraine without aura and without status migrainosus  - onabotulinumtoxin A (  BOTOX) injection 100 Units; 100 Units by IntraMUSCULAR route once.   - onabotulinumtoxin A (BOTOX) injection 100 Units; 100 Units by Injection route once.   - Chemodenervation for Chronic Migraine; Future    Other Orders  - ergocalciferol (VITAMIN D) 50000 UNIT capsule;   - RELISTOR 12 MG/0.6ML SOLN;   - Cancel: onabotulinumtoxin A (BOTOX) injection 100 Units; 100 Units by IntraMUSCULAR route once.         As the patient has had significant improvement with her headaches with the Botox injections, I recommend she proceed with more injections. She will require Authorization for more sessions.    - RTC in 3 months

## 2014-07-31 MED ORDER — BOTULINUM TOXIN TYPE A 100 UNIT IM SOLR
100.00 [IU] | Freq: Once | INTRAMUSCULAR | Status: AC
Start: 2014-07-31 — End: 2014-08-01

## 2014-10-21 ENCOUNTER — Encounter (INDEPENDENT_AMBULATORY_CARE_PROVIDER_SITE_OTHER): Payer: Self-pay | Admitting: Neurology

## 2014-10-21 ENCOUNTER — Ambulatory Visit (INDEPENDENT_AMBULATORY_CARE_PROVIDER_SITE_OTHER): Payer: BC Managed Care – PPO | Admitting: Neurology

## 2014-10-21 VITALS — BP 92/61 | HR 70 | Temp 98.1°F | Resp 16 | Ht 63.0 in | Wt 120.0 lb

## 2014-10-21 DIAGNOSIS — G43709 Chronic migraine without aura, not intractable, without status migrainosus: Secondary | ICD-10-CM

## 2014-10-21 DIAGNOSIS — G43719 Chronic migraine without aura, intractable, without status migrainosus: Secondary | ICD-10-CM

## 2014-10-21 MED ORDER — BOTULINUM TOXIN TYPE A 100 UNIT IM SOLR
200.00 [IU] | Freq: Once | INTRAMUSCULAR | Status: AC
Start: 2014-10-21 — End: 2014-10-21
  Administered 2014-10-21: 200 [IU] via INTRAMUSCULAR

## 2014-10-21 NOTE — Progress Notes (Signed)
BOTULINUM TOXIN INJECTION VISIT    NAME:  Kathy Mckenzie  DOB:  12/02/1960  MRN:  [id number]  DATE OF EVALUATION:  10/21/2014    REASON FOR VISIT:  Botulinum toxin injection(s)    HISTORY:  Kathy Mckenzie is a 54 year old-year old female with migraines last seen 07/20/14.  T  Still tends to have slight headaches all the time. But much improved than before the Botox.   Headaches still predominantly around the base and crown region.   Wore off 2 weeks ago. Had more intense headaches.     Had drooping right>left eye lid and brow. Requesting injections there around the eyes.     Did not restart CBD oil.       CURRENT MEDICATIONS  Current Outpatient Prescriptions   Medication Sig    diazepam (VALIUM) 5 MG tablet     DIAZEPAM PO     ergocalciferol (VITAMIN D) 50000 UNIT capsule     gabapentin (NEURONTIN) 300 MG capsule 300 mg 2 times daily.    GABAPENTIN PO     HYDROcodone-acetaminophen (NORCO) 10-325 MG tablet Take 1 tablet by mouth every 6 hours as needed for Moderate Pain (Pain Score 4-6).    levothyroxine (SYNTHROID) 50 MCG tablet Take 50 mcg by mouth daily.    OnabotulinumtoxinA (BOTOX IJ) Inject 100 Units under the skin Every 3 months.    polyethylene glycol (GLYCOLAX) powder     RELISTOR 12 MG/0.6ML SOLN     zolpidem (AMBIEN) 5 MG tablet Take 5 mg by mouth nightly as needed for Insomnia.     No current facility-administered medications for this visit.       NEUROLOGICAL EXAMINATION  Vitals:  BP 92/61 mmHg   Pulse 70   Temp(Src) 98.1 F (36.7 C) (Oral)   Resp 16   Ht 5\' 3"  (1.6 m)   Wt 54.432 kg (120 lb)   BMI 21.26 kg/m2   SpO2 97%  Normal CN, motor and sensory exams.    BOTULINUM TOXIN PROCEDURE NOTE      Areas to be injected were prepped with isopropyl alcohol.            INJECTION LOG   MUSCLE  SIDE  DILUTION  #SITES  UNITS  PRIOR DOSE    1.  Procerus  -  2:1  1  5  5     2.  Corrugators  B   1 each  2.5  2.5    3.  Frontalis  B   4  2.5 each  5 each    4.  Temporalis   B   2 each  5 each  5 each    5.  Supra-auricular  B   2 each  5 each  5    6.  Base of skull  B   1 each  5 each  10    7.  CxPS  B   2 each  5 each  5    8.  Trapezius  B   1 each  10 each  5       TOTAL  DOSE:   WASTE: 0    Also  B orbicularis ocul   2.5 units each in 2 sites   Masseter B  1 each 10 each       10  Posterior mastoid B                                      1            10 units       5  Below B mastoid below ear                           1            5u  B posterior head                                             6            5u each    Total dose: 200 units  Wastage : none               The patient tolerated the procedure well with no adverse effects.    ASSESSMENT AND PLAN  1. Chronic migraine, intractable, without status migrainosus- Based on the signs and symptoms consistent with this disorder, botulinum toxin therapy is necessary and consistent with generally accepted professional standards within the medical literature and provided at safe and effective dosages.   - RTC in 3 months      TOTAL TIME SPENT WITH PATIENT:  45 minutes

## 2015-01-14 ENCOUNTER — Ambulatory Visit (INDEPENDENT_AMBULATORY_CARE_PROVIDER_SITE_OTHER): Payer: BC Managed Care – PPO | Admitting: Neurology

## 2015-01-14 VITALS — BP 97/62 | HR 54 | Temp 97.6°F | Resp 16 | Ht 63.0 in | Wt 115.0 lb

## 2015-01-14 DIAGNOSIS — G43719 Chronic migraine without aura, intractable, without status migrainosus: Secondary | ICD-10-CM

## 2015-01-14 MED ORDER — MORPHINE SULFATE ER 15 MG PO TBCR
15.00 mg | EXTENDED_RELEASE_TABLET | Freq: Two times a day (BID) | ORAL | Status: DC
Start: 2015-01-12 — End: 2016-04-03

## 2015-01-14 MED ORDER — TIZANIDINE HCL 4 MG OR TABS
4.00 mg | ORAL_TABLET | ORAL | Status: DC
Start: 2015-01-12 — End: 2015-08-23

## 2015-01-14 NOTE — Progress Notes (Signed)
BOTULINUM TOXIN INJECTION VISIT    NAME:  Kathy Mckenzie  DOB:  02/28/1961  MRN:  [id number]  DATE OF EVALUATION:  01/14/2015    REASON FOR VISIT:  Botulinum toxin injection(s)    HISTORY:  Kathy Mckenzie is a 54 year old-year old female with migraines ast seen 10/21/14.     Botox effect lasted longer.   Did have difficulty opening her mouth all the way x 24month        CURRENT MEDICATIONS  Current Outpatient Prescriptions   Medication Sig    diazepam (VALIUM) 5 MG tablet     DIAZEPAM PO     ergocalciferol (VITAMIN D) 50000 UNIT capsule     gabapentin (NEURONTIN) 300 MG capsule 300 mg 2 times daily.    GABAPENTIN PO     HYDROcodone-acetaminophen (NORCO) 10-325 MG tablet Take 1 tablet by mouth every 6 hours as needed for Moderate Pain (Pain Score 4-6).    levothyroxine (SYNTHROID) 50 MCG tablet Take 50 mcg by mouth daily.    morphine (ORAMORPH) 15 MG Controlled-Release tablet Take 15 mg by mouth every 12 hours.      OnabotulinumtoxinA (BOTOX IJ) Inject 100 Units under the skin Every 3 months.    polyethylene glycol (GLYCOLAX) powder     RELISTOR 12 MG/0.6ML SOLN     tiZANidine (ZANAFLEX) 4 MG tablet Take 4 mg by mouth.      zolpidem (AMBIEN) 5 MG tablet Take 5 mg by mouth nightly as needed for Insomnia.     No current facility-administered medications for this visit.        NEUROLOGICAL EXAMINATION  Vitals:  BP 97/62  Pulse 54  Temp 97.6 F (36.4 C) (Oral)  Resp 16  Ht  (1.6 m)  Wt 52.2 kg (115 lb)  SpO2 98%  BMI 20.37 kg/m2      Mild atrophy B masseters, normal strength    BOTULINUM TOXIN PROCEDURE NOTE      Areas to be injected were prepped with isopropyl alcohol.     INJECTION LOG   MUSCLE  SIDE  DILUTION  #SITES  UNITS  PRIOR DOSE    1.  Procerus  -  2:1  1  5  5     2.  Corrugators  B   1 each  2.5  2.5    3.  Frontalis  B   4  2.5 each  2.5 each    4.  Temporalis  B   2 each  5 each  5 each    5.  Supra-auricular  B    2 each  5 each  5    6.  Base of skull  B   1 each  5 each  10    7.  CxPS  B   2 each  5 each  5    8.  Trapezius  B   1 each  15 each  10       TOTAL  DOSE:   WASTE: 0    Also  B orbicularis ocul 2.5 units each in 2 sites   Masseter B       1 each   5 each              10  Posterior mastoid B      1           10 units            10  Below B mastoid below  ear     1site        5u  B posterior head      6sites      5u each    Total dose: 200 units  Wastage : none        The patient tolerated the procedure well with no adverse effects.    Regarding the  bilateral masseters, there is slight atrophy and It is possible and there was diffusion from the masseters as a result near into the pterygoid muscles. This could have resulted in the difficulty opening the mouth. She does have masseter strength, however, I will have to decrease the dose in order to help with mild atrophy.      ASSESSMENT AND PLAN  1. Chronic migraine, intractable, without status migrainosus- Based on the signs and symptoms consistent with this disorder, botulinum toxin therapy is necessary and consistent with generally accepted professional standards within the medical literature and provided at safe and effective dosages.   - RTC in 3 months  TOTAL TIME SPENT WITH PATIENT: 

## 2015-03-26 DIAGNOSIS — G43719 Chronic migraine without aura, intractable, without status migrainosus: Secondary | ICD-10-CM

## 2015-03-26 MED ORDER — BOTULINUM TOXIN TYPE A 100 UNIT IM SOLR
200.00 [IU] | Freq: Once | INTRAMUSCULAR | Status: AC
Start: 2015-03-26 — End: 2015-03-26
  Administered 2015-03-26: 200 [IU] via INTRAMUSCULAR

## 2015-04-08 ENCOUNTER — Ambulatory Visit (INDEPENDENT_AMBULATORY_CARE_PROVIDER_SITE_OTHER): Payer: BC Managed Care – PPO | Admitting: Neurology

## 2015-04-08 ENCOUNTER — Encounter (INDEPENDENT_AMBULATORY_CARE_PROVIDER_SITE_OTHER): Payer: Self-pay | Admitting: Neurology

## 2015-04-08 VITALS — BP 107/61 | HR 70 | Temp 98.2°F | Resp 16 | Ht 63.0 in | Wt 120.0 lb

## 2015-04-08 DIAGNOSIS — G43709 Chronic migraine without aura, not intractable, without status migrainosus: Secondary | ICD-10-CM

## 2015-04-08 NOTE — Progress Notes (Signed)
BOTULINUM TOXIN INJECTION VISIT    NAME:  Kathy Mckenzie  DOB:  07-18-1961  MRN:  [id number]  DATE OF EVALUATION:  04/08/2015    REASON FOR VISIT:  Botulinum toxin injection(s)    HISTORY:  Kathy Mckenzie is a 54 year old-year old female with chronic migraines last seen 01/14/15.      With the last set of injections, she had better pain control which lasted up until 3 weeks ago.   However, she felt groggy and straining in the anterior neck. She also developed raising on the right eyebrow. She also had difficulty opening up her mouth to eat a sandwich.      She fainted last week, but this was multifactorial.     CURRENT MEDICATIONS  Current Outpatient Prescriptions   Medication Sig    diazepam (VALIUM) 5 MG tablet     DIAZEPAM PO     ergocalciferol (VITAMIN D) 50000 UNIT capsule     gabapentin (NEURONTIN) 300 MG capsule 300 mg 3 times daily.      GABAPENTIN PO     HYDROcodone-acetaminophen (NORCO) 10-325 MG tablet Take 1 tablet by mouth every 6 hours as needed for Moderate Pain (Pain Score 4-6).    levothyroxine (SYNTHROID) 50 MCG tablet Take 50 mcg by mouth daily.    morphine (ORAMORPH) 15 MG Controlled-Release tablet Take 15 mg by mouth every 12 hours.      OnabotulinumtoxinA (BOTOX IJ) Inject 100 Units under the skin Every 3 months.    polyethylene glycol (GLYCOLAX) powder     RELISTOR 12 MG/0.6ML SOLN     tiZANidine (ZANAFLEX) 4 MG tablet Take 4 mg by mouth.      zolpidem (AMBIEN) 5 MG tablet Take 5 mg by mouth nightly as needed for Insomnia.     No current facility-administered medications for this visit.        NEUROLOGICAL EXAMINATION  Vitals:  BP 107/61  Pulse 70  Temp 98.2 F (36.8 C) (Oral)  Resp 16  Ht  (1.6 m)  Wt 54.4 kg (120 lb)  LMP 06/30/2013  SpO2 97%  BMI 21.26 kg/m2   mild wrinkling seen right frontotemporal region    BOTULINUM TOXIN PROCEDURE NOTE    Areas to be injected were prepped with isopropyl alcohol.          INJECTION LOG   MUSCLE  SIDE  DILUTION  #SITES   UNITS  PRIOR DOSE    1.  Procerus  -  2:1  1  5  5     2.  Corrugators  B   1 each  5  2.5    3.  Frontalis  B   4  5 each  2.5 each    4.  Temporalis  B   1each  7.5 each  5 each    5.  Base of skull  B   1 each  5 each  10    6.  CxPS  B   2 each  5 each  5    7.  Trapezius  B   1 each  10 each  10       TOTAL  DOSE:  100 WASTE: 0        Toxin Brand Utilized:  Botox  [employer] Used.    Though the effect was better and lasted longer, she had too many adverse effects. Thus, reduced back to original dose without jaw injections.     The patient tolerated the  procedure well with no adverse effects.    ASSESSMENT AND PLAN  1. Chronic migraine, intractable, without status migrainosus- Based on the signs and symptoms consistent with this disorder, botulinum toxin therapy is necessary and consistent with generally accepted professional standards within the medical literature and provided at safe and effective dosages.   - RTC in 3 months      TOTAL TIME SPENT WITH PATIENT: 35 minutes

## 2015-04-11 MED ORDER — BOTULINUM TOXIN TYPE A 100 UNIT IM SOLR
100.00 [IU] | Freq: Once | INTRAMUSCULAR | Status: AC
Start: 2015-04-11 — End: 2015-04-08
  Administered 2015-04-08: 100 [IU] via INTRAMUSCULAR

## 2015-07-15 ENCOUNTER — Encounter (INDEPENDENT_AMBULATORY_CARE_PROVIDER_SITE_OTHER): Payer: BC Managed Care – PPO | Admitting: Neurology

## 2015-08-03 ENCOUNTER — Telehealth (INDEPENDENT_AMBULATORY_CARE_PROVIDER_SITE_OTHER): Payer: Self-pay | Admitting: Neurology

## 2015-08-03 DIAGNOSIS — G43119 Migraine with aura, intractable, without status migrainosus: Principal | ICD-10-CM

## 2015-08-03 NOTE — Telephone Encounter (Signed)
PC from pt, wanting referral to another neuro facility for botox as no neuro provider at [facility] presently. Referral ordered, pt given contact info.

## 2015-08-23 ENCOUNTER — Telehealth (INDEPENDENT_AMBULATORY_CARE_PROVIDER_SITE_OTHER): Payer: Self-pay | Admitting: Neurology

## 2015-08-23 ENCOUNTER — Ambulatory Visit (INDEPENDENT_AMBULATORY_CARE_PROVIDER_SITE_OTHER): Payer: BC Managed Care – PPO | Admitting: Neurology

## 2015-08-23 ENCOUNTER — Encounter (INDEPENDENT_AMBULATORY_CARE_PROVIDER_SITE_OTHER): Payer: Self-pay | Admitting: Neurology

## 2015-08-23 VITALS — BP 143/85 | HR 91 | Ht 63.0 in | Wt 120.0 lb

## 2015-08-23 DIAGNOSIS — G43719 Chronic migraine without aura, intractable, without status migrainosus: Secondary | ICD-10-CM

## 2015-08-23 MED ORDER — OPANA ER 20 MG PO T12A
EXTENDED_RELEASE_TABLET | ORAL | 0 refills | Status: DC
Start: 2015-08-12 — End: 2016-04-03

## 2015-08-23 MED ORDER — TIZANIDINE HCL 4 MG OR TABS
ORAL_TABLET | ORAL | 3 refills | Status: DC
Start: 2015-08-23 — End: 2016-09-25

## 2015-08-23 MED ORDER — VENLAFAXINE HCL 37.5 MG OR CP24
ORAL_CAPSULE | ORAL | 2 refills | Status: DC
Start: 2015-07-23 — End: 2016-04-03

## 2015-08-23 NOTE — Addendum Note (Signed)
Addended by: Delia Heady on: 08/23/2015 02:11 PM     Modules accepted: Orders
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
## 2015-08-23 NOTE — Patient Instructions (Signed)
I recommended the following workup:   Head MRI scan with and without gadolinium.  Blood tests: Bun, Cr, ESR, CRP, TSH, electrolytes, CBC, AST, ALT.  Keep a headache diary to document headache frequency and any triggers.  The patient was encouraged to attend the HA class at [email or web address].   NTI splint info [email or web address]  Referral for Physical Therapy for scapular stabilization exercises was made.    For preventive treatment: Botox 200 units, inject per the PREEMPT protocol every 12 weeks. Follow up for this treatment to be arranged as soon as it is approved.  In addition, the patient was advised to take: Magnesium oxide 250 mg twice a day.    Decrease caffeine intake to less than 200 mg/day and start an aerobic exercise program with stress reduction and yoga.    For acute treatment: Zanaflex 4 mg daily when necessary migraine.  The patient was instructed to limit all acute medication of any kind (includes over the counter medications) to less than 2-days use per week.  Nerve blocks as needed.  All medication side effects were reviewed in detail.  Follow-up has been arranged to coordinate the patient's care.  Book patient with Advanced Practitioner for first follow up visit.

## 2015-08-23 NOTE — Telephone Encounter (Signed)
At time of check out Dr. Selinda Flavin notes were NOT complete, look like he has a few orders to place we at check out DO NOT place those orders, please have him place orders and f/u with patient to let her know what is needed for future care. Thank you//AE

## 2015-10-01 ENCOUNTER — Telehealth (INDEPENDENT_AMBULATORY_CARE_PROVIDER_SITE_OTHER): Payer: Self-pay | Admitting: Physician Assistant

## 2015-10-01 NOTE — Telephone Encounter (Signed)
MRI brain with and without contrast 09/21/15: "Few scattered periventricular and subcortical FLAIR hyperintensities representing mild microvascular disease and/or sequela of chronic migraine headaches.  8 mm meningioma adjacent to the left frontal lobe.  Otherwise, unremarkable examination."

## 2015-10-07 ENCOUNTER — Ambulatory Visit (INDEPENDENT_AMBULATORY_CARE_PROVIDER_SITE_OTHER): Payer: BC Managed Care – PPO | Admitting: Physician Assistant

## 2015-10-11 ENCOUNTER — Ambulatory Visit (INDEPENDENT_AMBULATORY_CARE_PROVIDER_SITE_OTHER): Payer: BC Managed Care – PPO | Admitting: Physician Assistant

## 2015-10-11 ENCOUNTER — Other Ambulatory Visit (INDEPENDENT_AMBULATORY_CARE_PROVIDER_SITE_OTHER): Payer: Self-pay

## 2015-10-11 VITALS — BP 114/76 | HR 66 | Ht 63.0 in | Wt 120.0 lb

## 2015-10-11 DIAGNOSIS — G43719 Chronic migraine without aura, intractable, without status migrainosus: Secondary | ICD-10-CM

## 2015-10-11 MED ORDER — HYDROCODONE-ACETAMINOPHEN 10-325 MG OR TABS
ORAL_TABLET | ORAL | 0 refills | Status: DC
Start: 2015-09-21 — End: 2016-09-25

## 2015-10-11 MED ORDER — VENLAFAXINE HCL 75 MG OR CP24
ORAL_CAPSULE | ORAL | 3 refills | Status: DC
Start: 2015-09-11 — End: 2016-04-03

## 2015-10-11 MED ORDER — POLYETHYLENE GLYCOL 3350 OR POWD
ORAL | 0 refills | Status: DC
Start: 2015-10-02 — End: 2016-09-25

## 2015-10-11 MED ORDER — BOTOX 200 UNITS IJ SOLR
INTRAMUSCULAR | Status: DC
Start: 2015-10-01 — End: 2016-09-25

## 2015-10-11 MED ORDER — SUMATRIPTAN-NAPROXEN 85-500 MG OR TABS
1.0000 | ORAL_TABLET | ORAL | 11 refills | Status: DC | PRN
Start: 2015-10-11 — End: 2016-09-25

## 2015-10-11 NOTE — Patient Instructions (Addendum)
1. Follow up as previously scheduled for botox with Cristie tomorrow.     2. Increase Zanaflex 4mg  to 2 tabs nightly if needed.     3. New Order for physical therapy for scapular stabilization, headaches, TMJ with manuel therapy. May consider [facility] in [place] with Dr. Corky Downs.     4. Supplements as listed below.     5. New Rx for Treximet for acute headaches 1 dose at onset of headache may repeat 2 hours later if needed, No more than 2 doses in a 24 hour period. No more than two days per week.     6. Patient to see Dr. Don Perking in [facility] for NTI splint.     Headache Prevention With Complementary and Alternative Medicine    In patient-centered approaches the best of conventional medicine is used with complementary and alternative medicine (CAM) to combine the best of both for patient benefit. CAM methods, used in this way, are becoming more commonplace in headache management. CAM may be indicated if you simply prefer it, have low tolerance of or poor response to conventional drugs, or have a medical contraindication to certain drugs.     If you use herbal therapy, you are one of the 20% of Americans who do so for a health condition and/or health promotion. But more than half do not disclose their use. However, reporting is necessary, as 72% of those using medicinal herbs also use prescription drugs, and 84% also use over-the-counter medications. If you use herbal preparations, you are more likely to be between 59 and 105 years of age, uninsured or underinsured, female, have higher education levels, live in the [place] or self-identify as "non-Hispanic, other." Roughly two-thirds of Americans using medicinal herbs do not do so for proven uses.     There are 5 commonly utilized CAM medications for headache prevention. While considering preventive CAM therapy realize that this should be just a component of your overall program aimed at reducing trigger factors, implementing behavioral headache management and  taking medications acceptable to you and your practitioner. The goal is to find effective means to reduce, by at least 50%, the number of headaches, their intensity or their length, while improving function and quality of life.   The American Migraine Prevalence and Prevention study suggests considering drug prevention with only 4 days per month of headache and no functional limitations or for 2 days per month with severe impairment. While nearly 40% of headache sufferers meet criteria for prevention just over 10% receive any.     If prevention is warranted, discuss your motivations, preferences, and concerns with your providers. Be aware that CAM headache therapies are not as rigorously proven as many conventional preventatives and not as strictly regulated by the Food and Drug Administration of the [place] as are prescription therapies and devices. They are classified as dietary supplements and not drugs; and therefore no proof of safe manufacturing, effectiveness, or safety is required for marketing in the [place]. You should have concern for the lack of industry standardization as regards the contents and purity of herbals, along with batch-to-batch consistency. Finally, one should recall that the total number of subjects involved in studies evaluating "herbals" for headache treatment typically is very small. Thus, the admonition: "consumers beware."       MAGNESIUM   Low brain magnesium levels in migraineurs are reported in several studies. Magnesium prevention trials have yielded mixed results. It appears that prolonged "high dose" supplementation of a minimum of 400-600 mg  per day may be required to achieve any benefit from therapy. No comparison of different magnesium formulations exists to inform decisions. Adverse events are mainly gastrointestinal (diarrhea predominating). Amino acid chelates may be better tolerated, but are lower strength and therefore cost is slightly higher. There is no evidence of any short-  or long-term safety issues if serious kidney disease is absent. 967     RIBOFLAVIN   Riboflavin is a water-soluble essential vitamin (B2). There is no proof of best dosage or definite proof of efficacy. Doses of 25 mg and 400 mg have had benefit. Adverse events are limited to diarrhea and frequent urination of fluorescent yellow urine. No known long-term toxicity is known.     COENZYMEQ10   CoQ10, like riboflavin, plays a role in cell energy production. The preparation used in the only rigorous study is not commercially available. The dose of 300 mg was divided into 100 mg 3 times per day. Adverse events were not different between groups. CoQ10 is well-tolerated and long-term safety is acceptable.     MELATONIN   There are many reasons melatonin should be beneficial in headache, but no proof exists presently. It has been shown to be useful for insomnia.     VITAMIN D3   While not evidence based, vitamin D deficiency/insufficiency is common and harmful. Consider optimizing to a blood level of between 50-80, which may require 1000 IU per every 25-30 pounds. Such dosing requires a 33-month blood level for 25(OH)D and calcium. Visit vitamindcouncil.org and discuss with your practitioner.     OTHER CAM THERAPIES   In a survey of CAM organizations aromatherapy, Bowen technique, chiropractic, hypnotherapy, massage, nutrition, reflexology, Reiki, and yoga were all recommended for headache. Little to no proof of benefit exists on these approaches. Fish oil research is currently negative. 5-HTP may not affect headache. Phytoestrogens may be promising.     SUMMARY   As none of the herbal treatments above have been tested in pregnancy, it is best for pregnant women to avoid all but magnesium. No firm consensus exists as to the relative effectiveness CAM agents, but given the number of patients studied and data available, I tentatively suggest the rank order may be: Petadolex _ Magnesium > Feverfew (if MIG-99 forms become  commercially available) > Riboflavin > CoenzymeQ10 >> Melatonin.     Norris Cross. Ladona Ridgel, MD Adjunct Professor of Neurology, [facility] of Medicine Director, [facility] and [place], [place], [place], [place]
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
## 2015-10-12 ENCOUNTER — Ambulatory Visit (INDEPENDENT_AMBULATORY_CARE_PROVIDER_SITE_OTHER): Payer: BC Managed Care – PPO | Admitting: Nurse Practitioner

## 2015-10-12 ENCOUNTER — Encounter (INDEPENDENT_AMBULATORY_CARE_PROVIDER_SITE_OTHER): Payer: Self-pay | Admitting: Nurse Practitioner

## 2015-10-12 VITALS — BP 121/84 | HR 85 | Ht 63.0 in | Wt 133.0 lb

## 2015-10-12 DIAGNOSIS — G43709 Chronic migraine without aura, not intractable, without status migrainosus: Secondary | ICD-10-CM

## 2015-10-12 DIAGNOSIS — G43719 Chronic migraine without aura, intractable, without status migrainosus: Secondary | ICD-10-CM

## 2015-10-12 MED ORDER — ONABOTULINUMTOXINA 200 UNITS IJ SOLR
200.00 [IU] | Freq: Once | INTRAMUSCULAR | Status: AC
Start: 2015-10-12 — End: 2015-10-12
  Administered 2015-10-12: 200 [IU] via INTRAMUSCULAR

## 2015-10-12 NOTE — Patient Instructions (Signed)
Do not rub the injection areas and stay upright for 2 hours.    Book 6 week OV and 12 week Botox injection.

## 2015-10-12 NOTE — Procedures (Addendum)
BOTOX PROCEDURE NOTE      SUBJECTIVE:    The patient is here for Botox treatment.  Botox side effects: None  Headache frequency:  45/90 days  Headache intensity: 8/10  MIDAS: 135  HIT 6 Score: 35  Patient is very frustrated by her situation with her headaches. She is tearful. She has done botox in the past, not with our practice before. MRI results reviewed with her.     No flowsheet data found.  No suicidal ideation.    ALLERGIES:    Peanuts [peanut oil]    MEDICATIONS:    Current Outpatient Prescriptions on File Prior to Visit   Medication Sig Dispense Refill   . BOTOX 200 UNITS SOLR injection      . gabapentin (NEURONTIN) 300 MG capsule 300 mg 3 times daily.    2   . HYDROcodone-acetaminophen (NORCO) 10-325 MG tablet TK 1 T PO  TID PRN  0   . levothyroxine (SYNTHROID) 50 MCG tablet Take 50 mcg by mouth daily.     Marland Kitchen morphine (ORAMORPH) 15 MG Controlled-Release tablet Take 15 mg by mouth every 12 hours.       . OPANA ER 20 MG T12A TK 1 T PO  Q 12 H  0   . polyethylene glycol (GLYCOLAX) powder MIX 1 CAPFUL OF POWDER IN GLASS OF WATER AND DRINK ONCE TO TWICE A DAY PRN FOR CONSTIPATION  0   . sumatriptan-naproxen (TREXIMET) 85-500 MG tablets Take 1 tablet by mouth as needed for Migraine for up to 9 doses. If a second tablet is needed, separate the doses by at least 2 hours (max: 2 tablets in 24 hours). 9 tablet 11   . tiZANidine (ZANAFLEX) 4 MG tablet 1 QHS 30 tablet 3   . venlafaxine (EFFEXOR XR) 37.5 MG XR capsule   2   . venlafaxine (EFFEXOR XR) 75 MG XR capsule TK 2 CS PO QD  3     No current facility-administered medications on file prior to visit.        OBJECTIVE:    BP 121/84  Pulse 85  Ht 5\' 3"  (1.6 m)  Wt 60.3 kg (133 lb)  LMP 06/30/2013 (LMP Unknown)  BMI 23.56 kg/m2    Speech is fluent  No aphasia or confusion  EOMs are full  Full cervical ROM  No focal limb weakness  No ataxia    PROCEDURE:  Time out taken  Risks and benefits reviewed  Consent obtained  Dilution: 100 units in 2 cc preservative free  normal saline  Lot number documented  Total dose 200 units  Procerus 5 units  Corrugators 5 units each  Frontalis 10 units each  Temporalis 20 units each  Occipitalis 15 units each  Cervical paraspinals 10 units each  Traps 15 units each  O Oculli 5 units each  35 units were discarded  Well tolerated   No complications    DIAGNOSIS: Chronic migraine  ICD 10 code: G43.719    PLAN: Botox 200 units in 12 weeks  Book OV in 6 weeks to follow up after first botox treatment with our practice.    Electronically signed by: Lora Havens, NP   Signature Derived From Controlled Access Password, October 12, 2015, 10:13 AM  Visit details reviewed and approved by supervising provider Carolynn Comment, MD on 10/12/2015, 3:43 PM    Electronically signed by: Carolynn Comment, MD on 10/12/2015, 3:43 PM

## 2015-11-15 ENCOUNTER — Ambulatory Visit (INDEPENDENT_AMBULATORY_CARE_PROVIDER_SITE_OTHER): Payer: BC Managed Care – PPO | Admitting: Physician Assistant

## 2015-11-15 ENCOUNTER — Encounter (INDEPENDENT_AMBULATORY_CARE_PROVIDER_SITE_OTHER): Payer: Self-pay | Admitting: Physician Assistant

## 2015-11-15 VITALS — BP 132/81 | HR 78

## 2015-11-15 DIAGNOSIS — G43719 Chronic migraine without aura, intractable, without status migrainosus: Secondary | ICD-10-CM

## 2015-11-15 MED ORDER — BUSPIRONE HCL 5 MG OR TABS
5.00 mg | ORAL_TABLET | ORAL | 3 refills | Status: DC
Start: 2015-10-19 — End: 2016-09-25

## 2015-11-15 MED ORDER — SUMATRIPTAN 20 MG/ACT NA SOLN
1.0000 | Freq: Once | NASAL | 11 refills | Status: DC | PRN
Start: 2015-11-15 — End: 2017-03-22

## 2015-11-15 NOTE — Patient Instructions (Signed)
.   Follow up as previously scheduled for Botox 200 units every 12 weeks.     2. Continue with abortive treatment as needed for acute headaches: No more than 2 days per week.     3. New Rx for Sumatriptan Nasal spray for acute headaches 1 dose at onset of headache may repeat 2 hours later if needed, No more than 2 doses in a 24 hour period. No more than two days per week. Sample of Zomig given, may call if Zomig is more efficacy.     4. Follow up as previously scheduled for Botox.

## 2015-11-15 NOTE — Interdisciplinary (Signed)
 I, Kathy Mckenzie have reviewed allergies and medications with this patient.
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
## 2015-11-16 NOTE — Progress Notes (Signed)
Visit details reviewed and approved by supervising provider Carolynn Comment, MD on 11/16/2015, 11:29 AM    Electronically signed by: Carolynn Comment, MD on 11/16/2015, 11:29 AM

## 2016-01-03 ENCOUNTER — Ambulatory Visit (INDEPENDENT_AMBULATORY_CARE_PROVIDER_SITE_OTHER): Payer: BC Managed Care – PPO | Admitting: Physician Assistant

## 2016-01-03 ENCOUNTER — Encounter (INDEPENDENT_AMBULATORY_CARE_PROVIDER_SITE_OTHER): Payer: Self-pay | Admitting: Physician Assistant

## 2016-01-03 VITALS — Ht 63.0 in | Wt 135.0 lb

## 2016-01-03 DIAGNOSIS — G43709 Chronic migraine without aura, not intractable, without status migrainosus: Secondary | ICD-10-CM

## 2016-01-03 DIAGNOSIS — G43719 Chronic migraine without aura, intractable, without status migrainosus: Secondary | ICD-10-CM

## 2016-01-03 MED ORDER — ONABOTULINUMTOXINA 200 UNITS IJ SOLR
200.00 [IU] | Freq: Once | INTRAMUSCULAR | Status: AC
Start: 2016-01-03 — End: 2016-01-03
  Administered 2016-01-03: 200 [IU] via INTRAMUSCULAR

## 2016-01-03 NOTE — Patient Instructions (Signed)
.  Do not rub the injection areas and stay upright for 2 hours.

## 2016-01-03 NOTE — Procedures (Signed)
BOTOX PROCEDURE NOTE      SUBJECTIVE:    The patient is here for Botox treatment.  Botox side effects: None  Headache frequency:  25/90 days  Headache intensity: 07/10  MIDAS: 45  HIT 6 Score: 63  No flowsheet data found.  No suicidal ideation.    ALLERGIES:    Peanuts [peanut oil]    MEDICATIONS:    Current Outpatient Prescriptions on File Prior to Visit   Medication Sig Dispense Refill   . BOTOX 200 UNITS SOLR injection      . busPIRone (BUSPAR) 5 MG tablet 5 mg.  3   . gabapentin (NEURONTIN) 300 MG capsule 300 mg 3 times daily.    2   . HYDROcodone-acetaminophen (NORCO) 10-325 MG tablet TK 1 T PO  TID PRN  0   . levothyroxine (SYNTHROID) 50 MCG tablet Take 50 mcg by mouth daily.     Marland Kitchen morphine (ORAMORPH) 15 MG Controlled-Release tablet Take 15 mg by mouth every 12 hours.       . OPANA ER 20 MG T12A TK 1 T PO  Q 12 H  0   . polyethylene glycol (GLYCOLAX) powder MIX 1 CAPFUL OF POWDER IN GLASS OF WATER AND DRINK ONCE TO TWICE A DAY PRN FOR CONSTIPATION  0   . SUMAtriptan (IMITREX) 20 MG/ACT nasal spray Spray 1 spray (20 mg) into one nostril once as needed for Migraine for up to 1 dose. 6 bottle 11   . sumatriptan-naproxen (TREXIMET) 85-500 MG tablets Take 1 tablet by mouth as needed for Migraine for up to 9 doses. If a second tablet is needed, separate the doses by at least 2 hours (max: 2 tablets in 24 hours). 9 tablet 11   . tiZANidine (ZANAFLEX) 4 MG tablet 1 QHS 30 tablet 3   . venlafaxine (EFFEXOR XR) 37.5 MG XR capsule   2   . venlafaxine (EFFEXOR XR) 75 MG XR capsule TK 2 CS PO QD  3     No current facility-administered medications on file prior to visit.        OBJECTIVE:    Ht 5\' 3"  (1.6 m)  Wt 61.2 kg (135 lb)  LMP 06/30/2013 (LMP Unknown)  BMI 23.91 kg/m2    Speech is fluent  No aphasia or confusion  EOMs are full  Full cervical ROM  No focal limb weakness  No ataxia    PROCEDURE:  Time out taken  Risks and benefits reviewed  Consent obtained  Dilution: 100 units in 2 cc preservative free normal  saline  Lot number documented  Total dose 200 units  Procerus 5 units  Corrugators 5 units each  Frontalis 10 units each  Temporalis 20 units each  Occipitalis 15 units each  Cervical paraspinals 10 units each  Traps 15 units each  Rest discarded  Well tolerated   No complications    DIAGNOSIS: Chronic migraine  ICD 10 code: G43.719    PLAN: Botox 200 units in 12 weeks  If HA days exceed 20 per month, to arrange follow up with advanced practioner to improve preventive care.    Regards,        Electronically signed by: Glade Lloyd Son, PA on 01/03/2016, 3:59 PM     Encounter submitted for review by Terri Piedra, PA on 01/03/2016, 3:59 PM

## 2016-04-03 ENCOUNTER — Encounter (INDEPENDENT_AMBULATORY_CARE_PROVIDER_SITE_OTHER): Payer: Self-pay | Admitting: Physician Assistant

## 2016-04-03 ENCOUNTER — Ambulatory Visit (INDEPENDENT_AMBULATORY_CARE_PROVIDER_SITE_OTHER): Payer: BC Managed Care – PPO | Admitting: Physician Assistant

## 2016-04-03 VITALS — Ht 63.0 in | Wt 135.0 lb

## 2016-04-03 DIAGNOSIS — G43719 Chronic migraine without aura, intractable, without status migrainosus: Secondary | ICD-10-CM

## 2016-04-03 MED ORDER — ONABOTULINUMTOXINA 200 UNITS IJ SOLR
200.00 [IU] | Freq: Once | INTRAMUSCULAR | Status: AC
Start: 2016-04-03 — End: 2016-04-03
  Administered 2016-04-03: 200 [IU] via INTRAMUSCULAR

## 2016-04-03 NOTE — Interdisciplinary (Signed)
,  I Yetta Numbers have reviewed Medications and Allergies with patient.

## 2016-04-03 NOTE — Procedures (Addendum)
BOTOX PROCEDURE NOTE      SUBJECTIVE:    The patient is here for Botox treatment.  Botox side effects: None  Headache frequency:  60/90 days  Headache intensity: 6-7/10  MIDAS: 60  HIT 6 Score: 65  No flowsheet data found.  No suicidal ideation.    ALLERGIES:    Peanuts [peanut oil]    MEDICATIONS:    Current Outpatient Prescriptions on File Prior to Visit   Medication Sig Dispense Refill   . BOTOX 200 UNITS SOLR injection      . busPIRone (BUSPAR) 5 MG tablet 5 mg.  3   . gabapentin (NEURONTIN) 300 MG capsule 300 mg 3 times daily.    2   . HYDROcodone-acetaminophen (NORCO) 10-325 MG tablet TK 1 T PO  TID PRN  0   . levothyroxine (SYNTHROID) 50 MCG tablet Take 50 mcg by mouth daily.     . [DISCONTINUED] morphine (ORAMORPH) 15 MG Controlled-Release tablet Take 15 mg by mouth every 12 hours.       . [DISCONTINUED] OPANA ER 20 MG T12A TK 1 T PO  Q 12 H  0   . polyethylene glycol (GLYCOLAX) powder MIX 1 CAPFUL OF POWDER IN GLASS OF WATER AND DRINK ONCE TO TWICE A DAY PRN FOR CONSTIPATION  0   . SUMAtriptan (IMITREX) 20 MG/ACT nasal spray Spray 1 spray (20 mg) into one nostril once as needed for Migraine for up to 1 dose. 6 bottle 11   . sumatriptan-naproxen (TREXIMET) 85-500 MG tablets Take 1 tablet by mouth as needed for Migraine for up to 9 doses. If a second tablet is needed, separate the doses by at least 2 hours (max: 2 tablets in 24 hours). 9 tablet 11   . tiZANidine (ZANAFLEX) 4 MG tablet 1 QHS 30 tablet 3   . [DISCONTINUED] venlafaxine (EFFEXOR XR) 37.5 MG XR capsule   2   . [DISCONTINUED] venlafaxine (EFFEXOR XR) 75 MG XR capsule TK 2 CS PO QD  3     No current facility-administered medications on file prior to visit.        OBJECTIVE:    Ht 5\' 3"  (1.6 m)  Wt 61.2 kg (135 lb)  LMP 06/30/2013 (LMP Unknown)  BMI 23.91 kg/m2    Speech is fluent  No aphasia or confusion  EOMs are full  Full cervical ROM  No focal limb weakness  No ataxia    PROCEDURE:  Time out taken  Risks and benefits reviewed  Consent  obtained  Dilution: 100 units in 2 cc preservative free normal saline  Lot number documented  Total dose 200 units  Procerus 5 units  Corrugators 5 units each  Frontalis 10 units each  Temporalis 20 units each  Occipitalis 15 units each  Cervical paraspinals 10 units each  Traps 15 units each  Rest discarded  Well tolerated   No complications    DIAGNOSIS: Chronic migraine  ICD 10 code: G43.719    PLAN: Botox 200 units in 12 weeks  If HA days exceed 20 per month, to arrange follow up with advanced practioner to improve preventive care.    Regards,        Electronically signed by: Glade Lloyd Son, PA on 04/03/2016, 5:33 PM     Encounter submitted for review by Terri Piedra, PA on 04/03/2016, 5:33 PM  Electronically signed by: Carolynn Comment, MD   Signature Derived From Controlled Access Password, April 04, 2016, 9:32 AM

## 2016-07-03 ENCOUNTER — Ambulatory Visit (INDEPENDENT_AMBULATORY_CARE_PROVIDER_SITE_OTHER): Payer: BC Managed Care – PPO | Admitting: Nurse Practitioner

## 2016-07-03 ENCOUNTER — Encounter (INDEPENDENT_AMBULATORY_CARE_PROVIDER_SITE_OTHER): Payer: Self-pay | Admitting: Nurse Practitioner

## 2016-07-03 VITALS — Ht 63.0 in | Wt 135.0 lb

## 2016-07-03 DIAGNOSIS — G43719 Chronic migraine without aura, intractable, without status migrainosus: Secondary | ICD-10-CM

## 2016-07-03 DIAGNOSIS — G43709 Chronic migraine without aura, not intractable, without status migrainosus: Secondary | ICD-10-CM

## 2016-07-03 MED ORDER — ONABOTULINUMTOXINA 200 UNITS IJ SOLR
200.00 [IU] | Freq: Once | INTRAMUSCULAR | Status: AC
Start: 2016-07-03 — End: 2016-07-03
  Administered 2016-07-03: 200 [IU] via INTRAMUSCULAR

## 2016-07-03 NOTE — Interdisciplinary (Signed)
I Yetta Numbers have reviewed Medications and Allergies with patient.

## 2016-07-03 NOTE — Patient Instructions (Signed)
Continue Botox treatment at 12 week intervals.   Continue current preventative and abortive medication regimen.  Avoid using abortive medications more than 2 days out of a week.  Return to clinic sooner if worsening.

## 2016-07-03 NOTE — Procedures (Signed)
BOTOX PROCEDURE NOTE      SUBJECTIVE:    The patient is here for Botox treatment.  Botox side effects: None  Headache frequency:  50/90 days  Headache intensity: 6/10  MIDAS: 45  HIT 6 Score: 66    Feels her headaches are much better since starting Botox. THis is her forth treatment.   No flowsheet data found.  No suicidal ideation.    ALLERGIES:    Peanuts [peanut oil]    MEDICATIONS:    Current Outpatient Prescriptions on File Prior to Visit   Medication Sig Dispense Refill   . BOTOX 200 UNITS SOLR injection      . busPIRone (BUSPAR) 5 MG tablet 5 mg.  3   . gabapentin (NEURONTIN) 300 MG capsule 300 mg 3 times daily.    2   . HYDROcodone-acetaminophen (NORCO) 10-325 MG tablet TK 1 T PO  TID PRN  0   . levothyroxine (SYNTHROID) 50 MCG tablet Take 50 mcg by mouth daily.     . polyethylene glycol (GLYCOLAX) powder MIX 1 CAPFUL OF POWDER IN GLASS OF WATER AND DRINK ONCE TO TWICE A DAY PRN FOR CONSTIPATION  0   . SUMAtriptan (IMITREX) 20 MG/ACT nasal spray Spray 1 spray (20 mg) into one nostril once as needed for Migraine for up to 1 dose. 6 bottle 11   . sumatriptan-naproxen (TREXIMET) 85-500 MG tablets Take 1 tablet by mouth as needed for Migraine for up to 9 doses. If a second tablet is needed, separate the doses by at least 2 hours (max: 2 tablets in 24 hours). 9 tablet 11   . tiZANidine (ZANAFLEX) 4 MG tablet 1 QHS 30 tablet 3     No current facility-administered medications on file prior to visit.        OBJECTIVE:    Ht 5\' 3"  (1.6 m)  Wt 61.2 kg (135 lb)  LMP 06/30/2013 (LMP Unknown)  BMI 23.91 kg/m2    Speech is fluent  No aphasia or confusion  EOMs are full  Full cervical ROM  No focal limb weakness  No ataxia    PROCEDURE:  Time out taken  Risks and benefits reviewed  Consent obtained  Dilution: 100 units in 2 cc preservative free normal saline  Lot number documented  Total dose 200 units  Procerus 5 units  Corrugators 5 units each  Frontalis 10 units each  Temporalis 20 units each  Occipitalis 15 units  each  Cervical paraspinals 10 units each  Traps 15 units each  45 units were discarded  Well tolerated   No complications    DIAGNOSIS: Chronic migraine  ICD 10 code: G43.719    PLAN: Botox 200 units in 12 weeks    Electronically signed by: Lora Havens, NP   Signature Derived From Controlled Access Password, July 03, 2016, 4:12 PM

## 2016-08-14 ENCOUNTER — Other Ambulatory Visit (INDEPENDENT_AMBULATORY_CARE_PROVIDER_SITE_OTHER): Payer: Self-pay | Admitting: Neurology

## 2016-08-15 NOTE — Telephone Encounter (Signed)
08/15/16  Kathy Mckenzie, received eRx refill request from [facility], [place] - [address] [facility] Phone: [phone] for Borox 200 units #1 LF 06/12/16.  Thank you !! Daph

## 2016-09-25 ENCOUNTER — Encounter (INDEPENDENT_AMBULATORY_CARE_PROVIDER_SITE_OTHER): Payer: Self-pay | Admitting: Physician Assistant

## 2016-09-25 ENCOUNTER — Ambulatory Visit (INDEPENDENT_AMBULATORY_CARE_PROVIDER_SITE_OTHER): Payer: BC Managed Care – PPO | Admitting: Physician Assistant

## 2016-09-25 VITALS — Ht 63.0 in | Wt 135.0 lb

## 2016-09-25 DIAGNOSIS — G43709 Chronic migraine without aura, not intractable, without status migrainosus: Secondary | ICD-10-CM

## 2016-09-25 DIAGNOSIS — G43719 Chronic migraine without aura, intractable, without status migrainosus: Secondary | ICD-10-CM

## 2016-09-25 MED ORDER — ONABOTULINUMTOXINA 200 UNITS IJ SOLR
200.00 [IU] | Freq: Once | INTRAMUSCULAR | Status: AC
Start: 2016-09-25 — End: 2016-09-25
  Administered 2016-09-25: 200 [IU] via INTRAMUSCULAR

## 2016-09-25 NOTE — Patient Instructions (Signed)
Do not rub the injection areas and stay upright for 2 hours.

## 2016-09-25 NOTE — Interdisciplinary (Signed)
I Kathy Mckenzie have reviewed Medications and Allergies with patient.

## 2016-09-25 NOTE — Procedures (Signed)
BOTOX PROCEDURE NOTE      SUBJECTIVE:    The patient is here for Botox treatment.  Botox side effects: None  Headache frequency:  0/90 days  Headache intensity: 04/10  MIDAS: 31  HIT 6 Score: 60  No flowsheet data found.  No suicidal ideation.    ALLERGIES:    Peanuts [peanut oil]    MEDICATIONS:    Current Outpatient Prescriptions on File Prior to Visit   Medication Sig Dispense Refill   . [DISCONTINUED] BOTOX 200 UNITS SOLR injection      . [DISCONTINUED] busPIRone (BUSPAR) 5 MG tablet 5 mg.  3   . [DISCONTINUED] gabapentin (NEURONTIN) 300 MG capsule 300 mg 3 times daily.    2   . [DISCONTINUED] HYDROcodone-acetaminophen (NORCO) 10-325 MG tablet TK 1 T PO  TID PRN  0   . levothyroxine (SYNTHROID) 50 MCG tablet Take 50 mcg by mouth daily.     . [DISCONTINUED] polyethylene glycol (GLYCOLAX) powder MIX 1 CAPFUL OF POWDER IN GLASS OF WATER AND DRINK ONCE TO TWICE A DAY PRN FOR CONSTIPATION  0   . SUMAtriptan (IMITREX) 20 MG/ACT nasal spray Spray 1 spray (20 mg) into one nostril once as needed for Migraine for up to 1 dose. 6 bottle 11   . [DISCONTINUED] sumatriptan-naproxen (TREXIMET) 85-500 MG tablets Take 1 tablet by mouth as needed for Migraine for up to 9 doses. If a second tablet is needed, separate the doses by at least 2 hours (max: 2 tablets in 24 hours). 9 tablet 11   . [DISCONTINUED] tiZANidine (ZANAFLEX) 4 MG tablet 1 QHS 30 tablet 3     No current facility-administered medications on file prior to visit.        OBJECTIVE:    Ht 5\' 3"  (1.6 m)  Wt 61.2 kg (135 lb)  LMP 06/30/2013 (LMP Unknown)  BMI 23.91 kg/m2    Speech is fluent  No aphasia or confusion  EOMs are full  Full cervical ROM  No focal limb weakness  No ataxia    PROCEDURE:  Time out taken  Risks and benefits reviewed  Consent obtained  Dilution: 100 units in 2 cc preservative free normal saline  Lot number documented  Total dose 200 units  Procerus 5 units  Corrugators 5 units each  Frontalis 10 units each  Temporalis 20 units  each  Occipitalis 15 units each  Cervical paraspinals 10 units each  Traps 15 units each  Masseters 5 units each   35 units discarded  Well tolerated   No complications    DIAGNOSIS: Chronic migraine  ICD 10 code: G43.719    PLAN: Botox 200 units in 12 weeks  If HA days exceed 20 per month, to arrange follow up with advanced practioner to improve preventive care.    Regards,        Electronically signed by: Glade Lloyd Son, PA on 09/25/2016, 5:26 PM     Encounter submitted for review by Terri Piedra, PA on 09/25/2016, 5:26 PM

## 2016-12-27 ENCOUNTER — Ambulatory Visit (INDEPENDENT_AMBULATORY_CARE_PROVIDER_SITE_OTHER): Payer: BC Managed Care – PPO | Admitting: Medical

## 2016-12-27 ENCOUNTER — Encounter (INDEPENDENT_AMBULATORY_CARE_PROVIDER_SITE_OTHER): Payer: Self-pay | Admitting: Medical

## 2016-12-27 DIAGNOSIS — G43719 Chronic migraine without aura, intractable, without status migrainosus: Secondary | ICD-10-CM

## 2016-12-27 DIAGNOSIS — G43709 Chronic migraine without aura, not intractable, without status migrainosus: Principal | ICD-10-CM

## 2016-12-27 MED ORDER — ONABOTULINUMTOXINA 200 UNITS IJ SOLR
200.0000 [IU] | Freq: Once | INTRAMUSCULAR | Status: AC
Start: 2016-12-27 — End: 2016-12-27
  Administered 2016-12-27: 200 [IU] via INTRAMUSCULAR

## 2017-03-22 ENCOUNTER — Ambulatory Visit (INDEPENDENT_AMBULATORY_CARE_PROVIDER_SITE_OTHER): Payer: BC Managed Care – PPO | Admitting: Medical

## 2017-03-22 ENCOUNTER — Encounter (INDEPENDENT_AMBULATORY_CARE_PROVIDER_SITE_OTHER): Payer: Self-pay | Admitting: Medical

## 2017-03-22 DIAGNOSIS — G43709 Chronic migraine without aura, not intractable, without status migrainosus: Principal | ICD-10-CM

## 2017-03-22 MED ORDER — ONABOTULINUMTOXINA 200 UNITS IJ SOLR
200.00 [IU] | Freq: Once | INTRAMUSCULAR | Status: AC
Start: 2017-03-22 — End: 2017-03-22
  Administered 2017-03-22: 200 [IU] via INTRAMUSCULAR

## 2017-06-13 ENCOUNTER — Telehealth (INDEPENDENT_AMBULATORY_CARE_PROVIDER_SITE_OTHER): Payer: Self-pay | Admitting: Medical

## 2017-06-18 ENCOUNTER — Encounter: Payer: Self-pay | Admitting: Neurology

## 2017-06-18 NOTE — Telephone Encounter (Addendum)
Pt called and gave me info to Dr. Darl Pikes Tat office so I faxed over all reports to fax [phone].kc I called PT to let her know as well.

## 2017-08-23 DIAGNOSIS — Z0000 Encounter for general adult medical examination without abnormal findings: Secondary | ICD-10-CM | POA: Diagnosis not present

## 2017-08-23 DIAGNOSIS — Z131 Encounter for screening for diabetes mellitus: Secondary | ICD-10-CM | POA: Diagnosis not present

## 2017-08-23 DIAGNOSIS — Z136 Encounter for screening for cardiovascular disorders: Secondary | ICD-10-CM | POA: Diagnosis not present

## 2017-08-23 DIAGNOSIS — Z1211 Encounter for screening for malignant neoplasm of colon: Secondary | ICD-10-CM | POA: Diagnosis not present

## 2017-09-05 DIAGNOSIS — M797 Fibromyalgia: Secondary | ICD-10-CM | POA: Diagnosis not present

## 2017-09-05 DIAGNOSIS — G43909 Migraine, unspecified, not intractable, without status migrainosus: Secondary | ICD-10-CM | POA: Diagnosis not present

## 2017-09-10 DIAGNOSIS — H25013 Cortical age-related cataract, bilateral: Secondary | ICD-10-CM | POA: Diagnosis not present

## 2017-09-10 DIAGNOSIS — H2513 Age-related nuclear cataract, bilateral: Secondary | ICD-10-CM | POA: Diagnosis not present

## 2017-09-10 DIAGNOSIS — H2511 Age-related nuclear cataract, right eye: Secondary | ICD-10-CM | POA: Diagnosis not present

## 2017-09-10 DIAGNOSIS — H25043 Posterior subcapsular polar age-related cataract, bilateral: Secondary | ICD-10-CM | POA: Diagnosis not present

## 2017-09-12 ENCOUNTER — Ambulatory Visit (INDEPENDENT_AMBULATORY_CARE_PROVIDER_SITE_OTHER): Payer: BLUE CROSS/BLUE SHIELD | Admitting: Neurology

## 2017-09-12 ENCOUNTER — Encounter: Payer: Self-pay | Admitting: Neurology

## 2017-09-12 VITALS — BP 98/78 | HR 88 | Ht 63.0 in | Wt 135.0 lb

## 2017-09-12 DIAGNOSIS — G43709 Chronic migraine without aura, not intractable, without status migrainosus: Secondary | ICD-10-CM

## 2017-09-12 DIAGNOSIS — Z1231 Encounter for screening mammogram for malignant neoplasm of breast: Secondary | ICD-10-CM | POA: Diagnosis not present

## 2017-09-12 DIAGNOSIS — G43909 Migraine, unspecified, not intractable, without status migrainosus: Secondary | ICD-10-CM | POA: Diagnosis not present

## 2017-09-12 DIAGNOSIS — M797 Fibromyalgia: Secondary | ICD-10-CM | POA: Diagnosis not present

## 2017-09-12 NOTE — Progress Notes (Signed)
Submitted Botox auth [id number] [phone]
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
## 2017-09-12 NOTE — Patient Instructions (Signed)
We will set you up for Botox again

## 2017-09-19 DIAGNOSIS — T781XXA Other adverse food reactions, not elsewhere classified, initial encounter: Secondary | ICD-10-CM | POA: Diagnosis not present

## 2017-09-19 DIAGNOSIS — J309 Allergic rhinitis, unspecified: Secondary | ICD-10-CM | POA: Diagnosis not present

## 2017-09-19 DIAGNOSIS — J301 Allergic rhinitis due to pollen: Secondary | ICD-10-CM | POA: Diagnosis not present

## 2017-09-19 DIAGNOSIS — Z91010 Allergy to peanuts: Secondary | ICD-10-CM | POA: Diagnosis not present

## 2017-09-20 DIAGNOSIS — M797 Fibromyalgia: Secondary | ICD-10-CM | POA: Diagnosis not present

## 2017-09-20 DIAGNOSIS — G43909 Migraine, unspecified, not intractable, without status migrainosus: Secondary | ICD-10-CM | POA: Diagnosis not present

## 2017-09-27 DIAGNOSIS — M797 Fibromyalgia: Secondary | ICD-10-CM | POA: Diagnosis not present

## 2017-09-27 DIAGNOSIS — G43909 Migraine, unspecified, not intractable, without status migrainosus: Secondary | ICD-10-CM | POA: Diagnosis not present

## 2017-09-28 ENCOUNTER — Telehealth: Payer: Self-pay | Admitting: Neurology

## 2017-09-28 NOTE — Telephone Encounter (Signed)
Susan's patient.

## 2017-09-28 NOTE — Telephone Encounter (Signed)
Pt called and wanted to know how the approval process is coming along for her Botox treatments, please call and advise

## 2017-10-01 ENCOUNTER — Ambulatory Visit: Payer: Self-pay | Admitting: Neurology

## 2017-10-02 NOTE — Telephone Encounter (Signed)
Patient is calling back to find out about the botox and why no one hasn't called her back

## 2017-10-03 NOTE — Telephone Encounter (Signed)
Patient is calling back and needs to talk to someone today she states that no one has called her back

## 2017-10-04 ENCOUNTER — Encounter: Payer: Self-pay | Admitting: *Deleted

## 2017-10-04 DIAGNOSIS — M797 Fibromyalgia: Secondary | ICD-10-CM | POA: Diagnosis not present

## 2017-10-04 DIAGNOSIS — G43909 Migraine, unspecified, not intractable, without status migrainosus: Secondary | ICD-10-CM | POA: Diagnosis not present

## 2017-10-09 DIAGNOSIS — H25811 Combined forms of age-related cataract, right eye: Secondary | ICD-10-CM | POA: Diagnosis not present

## 2017-10-09 DIAGNOSIS — H2511 Age-related nuclear cataract, right eye: Secondary | ICD-10-CM | POA: Diagnosis not present

## 2017-10-15 ENCOUNTER — Encounter: Payer: Self-pay | Admitting: *Deleted

## 2017-10-15 DIAGNOSIS — M797 Fibromyalgia: Secondary | ICD-10-CM | POA: Diagnosis not present

## 2017-10-15 DIAGNOSIS — G43909 Migraine, unspecified, not intractable, without status migrainosus: Secondary | ICD-10-CM | POA: Diagnosis not present

## 2017-10-16 DIAGNOSIS — H25812 Combined forms of age-related cataract, left eye: Secondary | ICD-10-CM | POA: Diagnosis not present

## 2017-10-16 DIAGNOSIS — H2512 Age-related nuclear cataract, left eye: Secondary | ICD-10-CM | POA: Diagnosis not present

## 2017-10-18 ENCOUNTER — Ambulatory Visit (INDEPENDENT_AMBULATORY_CARE_PROVIDER_SITE_OTHER): Payer: BLUE CROSS/BLUE SHIELD | Admitting: Neurology

## 2017-10-18 DIAGNOSIS — G43709 Chronic migraine without aura, not intractable, without status migrainosus: Secondary | ICD-10-CM

## 2017-10-18 MED ORDER — ONABOTULINUMTOXINA 100 UNITS IJ SOLR
200.0000 [IU] | Freq: Once | INTRAMUSCULAR | Status: AC
  Administered 2017-10-18: 200 [IU] via INTRAMUSCULAR

## 2017-10-18 NOTE — Progress Notes (Signed)
[facility]   Procedure Note Botox  Attending: Dr. Moxie Kalil  Preoperative Diagnosis(es): Chronic migraine  Consent obtained from: The patient. Benefits discussed included, but were not limited to decreased muscle tightness, increased joint range of motion, and decreased pain.  Risk discussed included, but were not limited pain and discomfort, bleeding, bruising, excessive weakness, venous thrombosis, muscle atrophy and dysphagia.  Anticipated outcomes of the procedure as well as he risks and benefits of the alternatives to the procedure, and the roles and tasks of the personnel to be involved, were discussed with the patient, and the patient consents to the procedure and agrees to proceed. A copy of the patient medication guide was given to the patient which explains the blackbox warning.  Patients identity and treatment sites confirmed:  Yes.  Details of Procedure: Skin was cleaned with alcohol. Prior to injection, the needle plunger was aspirated to make sure the needle was not within a blood vessel.  There was no blood retrieved on aspiration.    Following is a summary of the muscles injected  And the amount of Botulinum toxin used:  Dilution 200 units of Botox was reconstituted with 4 ml of preservative free normal saline. Time of reconstitution: At the time of the office visit (<30 minutes prior to injection)   Injections  155 total units of Botox was injected with a 30 gauge needle.  Injection Sites: L occipitalis: 15 units- 3 sites  R occiptalis: 15 units- 3 sites  L upper trapezius: 15 units- 3 sites R upper trapezius: 15 units- 3 sits          L paraspinal: 10 units- 2 sites R paraspinal: 10 units- 2 sites  Face L frontalis(2 injection sites):10 units   R frontalis(2 injection sites):10 units         L corrugator: 5 units   R corrugator: 5 units           Procerus: 5 units   L temporalis: 20 units R temporalis: 20 units   Agent:  200 units of botulinum Type A  (Onobotulinum Toxin type A) was reconstituted with 4 ml of preservative free normal saline.  Time of reconstitution: At the time of the office visit (<30 minutes prior to injection)     Total injected (Units): 155  Total wasted (Units): 45  Patient tolerated procedure well without complications.   Reinjection is anticipated in 3 months. Return to clinic in 4.5 months.   

## 2017-10-30 DIAGNOSIS — F4321 Adjustment disorder with depressed mood: Secondary | ICD-10-CM | POA: Diagnosis not present

## 2017-11-13 DIAGNOSIS — F4321 Adjustment disorder with depressed mood: Secondary | ICD-10-CM | POA: Diagnosis not present

## 2017-11-20 DIAGNOSIS — G4700 Insomnia, unspecified: Secondary | ICD-10-CM | POA: Diagnosis not present

## 2017-11-20 DIAGNOSIS — M797 Fibromyalgia: Secondary | ICD-10-CM | POA: Diagnosis not present

## 2017-11-27 DIAGNOSIS — F4321 Adjustment disorder with depressed mood: Secondary | ICD-10-CM | POA: Diagnosis not present

## 2017-12-05 DIAGNOSIS — M797 Fibromyalgia: Secondary | ICD-10-CM | POA: Diagnosis not present

## 2017-12-05 DIAGNOSIS — G43909 Migraine, unspecified, not intractable, without status migrainosus: Secondary | ICD-10-CM | POA: Diagnosis not present

## 2017-12-10 DIAGNOSIS — H26493 Other secondary cataract, bilateral: Secondary | ICD-10-CM | POA: Diagnosis not present

## 2017-12-10 DIAGNOSIS — H26492 Other secondary cataract, left eye: Secondary | ICD-10-CM | POA: Diagnosis not present

## 2017-12-11 DIAGNOSIS — F4321 Adjustment disorder with depressed mood: Secondary | ICD-10-CM | POA: Diagnosis not present

## 2017-12-13 DIAGNOSIS — M797 Fibromyalgia: Secondary | ICD-10-CM | POA: Diagnosis not present

## 2017-12-13 DIAGNOSIS — G43909 Migraine, unspecified, not intractable, without status migrainosus: Secondary | ICD-10-CM | POA: Diagnosis not present

## 2017-12-18 DIAGNOSIS — G4700 Insomnia, unspecified: Secondary | ICD-10-CM | POA: Diagnosis not present

## 2017-12-18 DIAGNOSIS — G43909 Migraine, unspecified, not intractable, without status migrainosus: Secondary | ICD-10-CM | POA: Diagnosis not present

## 2017-12-18 DIAGNOSIS — M797 Fibromyalgia: Secondary | ICD-10-CM | POA: Diagnosis not present

## 2017-12-25 DIAGNOSIS — F4321 Adjustment disorder with depressed mood: Secondary | ICD-10-CM | POA: Diagnosis not present

## 2017-12-25 DIAGNOSIS — G43909 Migraine, unspecified, not intractable, without status migrainosus: Secondary | ICD-10-CM | POA: Diagnosis not present

## 2017-12-25 DIAGNOSIS — M797 Fibromyalgia: Secondary | ICD-10-CM | POA: Diagnosis not present

## 2017-12-26 DIAGNOSIS — H26491 Other secondary cataract, right eye: Secondary | ICD-10-CM | POA: Diagnosis not present

## 2018-01-10 ENCOUNTER — Telehealth: Payer: Self-pay

## 2018-01-10 NOTE — Telephone Encounter (Signed)
Called [facility] to set up delivery-[phone], spoke with Joycelynn. She states they have attempted to reach Pt on multiple occasions with no response. She advised I have Pt [phone]  I called Pt, no answer, no VM. Called Pt's husbands number, spoke with Dorene SorrowJerry, gave him telephone number for Pt to call to confirm delivery, and advised him of appt date 6/27 @ 1:30p, arrive a few minutes early.

## 2018-01-11 NOTE — Telephone Encounter (Signed)
Called [facility] [phone] to ensure Pt had called and set up delivery. Spoke with Tiffany. She said when Pt called to confirm shipment, claim was denied, needs new PA. My information indicates was good for 4 visits, and, that was not mentioned Was advised to call [phone]. Called and spoke with Raven, she took clinicals.  Botox approved 12/12/17 - 01/11/19 case [id number].  Called back to ensure delivery, spoke with Osceolaindy. She states I will need to call back on Monday, they are processing Rx call-[phone] and they will overnight if necessary.

## 2018-01-14 DIAGNOSIS — F4321 Adjustment disorder with depressed mood: Secondary | ICD-10-CM | POA: Diagnosis not present

## 2018-01-15 NOTE — Telephone Encounter (Signed)
Called [facility], spoke with Arline Aspindy B., she is NOT the representative I spoke with on 01/11/18. The Rx was not sent through for processing as I was assured was to be done. Arline AspCindy B took my direct number, she will call me later today to let me know that Rx was processed, and Botox is being shipped.

## 2018-01-15 NOTE — Telephone Encounter (Signed)
Called and spoke with Leonette Mostharles.  He is sending Botox overnight, will arrive tomorrow.

## 2018-01-16 NOTE — Telephone Encounter (Signed)
Kaitlin Moses. With [facility] called, LMOVM advising me the Botox did ship last night.

## 2018-01-16 NOTE — Telephone Encounter (Signed)
Botox rcvd 

## 2018-01-17 ENCOUNTER — Ambulatory Visit (INDEPENDENT_AMBULATORY_CARE_PROVIDER_SITE_OTHER): Payer: BLUE CROSS/BLUE SHIELD | Admitting: Neurology

## 2018-01-17 DIAGNOSIS — G43709 Chronic migraine without aura, not intractable, without status migrainosus: Secondary | ICD-10-CM | POA: Diagnosis not present

## 2018-01-17 MED ORDER — ONABOTULINUMTOXINA 100 UNITS IJ SOLR
200.0000 [IU] | Freq: Once | INTRAMUSCULAR | Status: AC
  Administered 2018-01-17: 200 [IU] via INTRAMUSCULAR

## 2018-01-17 NOTE — Progress Notes (Signed)
[facility]   Procedure Note Botox  Attending: Dr. Jaffe  Preoperative Diagnosis(es): Chronic migraine  Consent obtained from: The patient Benefits discussed included, but were not limited to decreased muscle tightness, increased joint range of motion, and decreased pain.  Risk discussed included, but were not limited pain and discomfort, bleeding, bruising, excessive weakness, venous thrombosis, muscle atrophy and dysphagia.  Anticipated outcomes of the procedure as well as he risks and benefits of the alternatives to the procedure, and the roles and tasks of the personnel to be involved, were discussed with the patient, and the patient consents to the procedure and agrees to proceed. A copy of the patient medication guide was given to the patient which explains the blackbox warning.  Patients identity and treatment sites confirmed Yes.  .  Details of Procedure: Skin was cleaned with alcohol. Prior to injection, the needle plunger was aspirated to make sure the needle was not within a blood vessel.  There was no blood retrieved on aspiration.    Following is a summary of the muscles injected  And the amount of Botulinum toxin used:  Dilution 200 units of Botox was reconstituted with 4 ml of preservative free normal saline. Time of reconstitution: At the time of the office visit (<30 minutes prior to injection)   Injections  155 total units of Botox was injected with a 30 gauge needle.  Injection Sites: L occipitalis: 15 units- 3 sites  R occiptalis: 15 units- 3 sites  L upper trapezius: 15 units- 3 sites R upper trapezius: 15 units- 3 sits          L paraspinal: 10 units- 2 sites R paraspinal: 10 units- 2 sites  Face L frontalis(2 injection sites):10 units   R frontalis(2 injection sites):10 units         L corrugator: 5 units   R corrugator: 5 units           Procerus: 5 units   L temporalis: 20 units R temporalis: 20 units   Agent:  200 units of botulinum Type A  (Onobotulinum Toxin type A) was reconstituted with 4 ml of preservative free normal saline.  Time of reconstitution: At the time of the office visit (<30 minutes prior to injection)     Total injected (Units): 155  Total wasted (Units): none wasted  Patient tolerated procedure well without complications.   Reinjection is anticipated in 3 months. Return to clinic in 6 weeks.   

## 2018-01-22 DIAGNOSIS — M797 Fibromyalgia: Secondary | ICD-10-CM | POA: Diagnosis not present

## 2018-01-22 DIAGNOSIS — G4700 Insomnia, unspecified: Secondary | ICD-10-CM | POA: Diagnosis not present

## 2018-01-29 DIAGNOSIS — F4321 Adjustment disorder with depressed mood: Secondary | ICD-10-CM | POA: Diagnosis not present

## 2018-01-29 DIAGNOSIS — M797 Fibromyalgia: Secondary | ICD-10-CM | POA: Diagnosis not present

## 2018-01-29 DIAGNOSIS — G43909 Migraine, unspecified, not intractable, without status migrainosus: Secondary | ICD-10-CM | POA: Diagnosis not present

## 2018-02-06 ENCOUNTER — Telehealth: Payer: Self-pay

## 2018-02-06 NOTE — Telephone Encounter (Signed)
Called and spoke with Pt. [place] has to go to [place] and is leaving early on the 27th.  04/17/18 will be 90 days since last Botox.

## 2018-02-06 NOTE — Telephone Encounter (Signed)
Ok with me 

## 2018-02-06 NOTE — Telephone Encounter (Signed)
Pt called and stated that she is going out of town on September 27th.  Asked that I cancel her Botox appointment.  States that she would like to have it done prior to going out of town.  [facility]  Looking in pt's chart - the 27th is 90 days from last Botox injection.

## 2018-02-07 NOTE — Telephone Encounter (Signed)
Spoke with pt.  Offered 09/26 @ 7:50am.  She states that after speaking with her husband, they figured out that they will actually be leaving for [place] on the 25th or 26th.  States that she should be back in town the following week and would like to schedule for then.    Dr. Everlena CooperJaffe - you have 7:50am slots available every day that week.  OK to use?

## 2018-02-11 NOTE — Telephone Encounter (Signed)
I got her on the books thank you

## 2018-03-01 ENCOUNTER — Ambulatory Visit (INDEPENDENT_AMBULATORY_CARE_PROVIDER_SITE_OTHER): Payer: BLUE CROSS/BLUE SHIELD | Admitting: Neurology

## 2018-03-01 ENCOUNTER — Encounter: Payer: Self-pay | Admitting: Neurology

## 2018-03-01 VITALS — BP 90/64 | HR 72 | Ht 63.0 in | Wt 125.0 lb

## 2018-03-01 DIAGNOSIS — G43709 Chronic migraine without aura, not intractable, without status migrainosus: Secondary | ICD-10-CM | POA: Diagnosis not present

## 2018-03-01 NOTE — Patient Instructions (Signed)
Follow up for next Botox °
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
## 2018-03-08 DIAGNOSIS — R5383 Other fatigue: Secondary | ICD-10-CM | POA: Diagnosis not present

## 2018-03-11 DIAGNOSIS — Z111 Encounter for screening for respiratory tuberculosis: Secondary | ICD-10-CM | POA: Diagnosis not present

## 2018-04-12 ENCOUNTER — Ambulatory Visit: Payer: BLUE CROSS/BLUE SHIELD | Admitting: Neurology

## 2018-04-19 ENCOUNTER — Ambulatory Visit: Payer: BLUE CROSS/BLUE SHIELD | Admitting: Neurology

## 2018-04-23 ENCOUNTER — Ambulatory Visit: Payer: BLUE CROSS/BLUE SHIELD | Admitting: Neurology

## 2018-04-23 NOTE — Progress Notes (Addendum)
Botox is apprvd until 01/11/19. Called [facility], an [employer] [phone], spoke to Raymond. Need to call dedicated team for Botox delivery [phone], called spoke to Chrystal, Botox will be delivered Thursday 04/25/18  FYI [facility]#[id number] PCN# [id number]

## 2018-04-29 ENCOUNTER — Telehealth: Payer: Self-pay | Admitting: Neurology

## 2018-04-29 NOTE — Telephone Encounter (Signed)
Called and advised Pt Botox was rcvd

## 2018-04-29 NOTE — Telephone Encounter (Signed)
Patient called and left a vm wanting to know about if her botox came in for her next appt. Please call her back at [phone]. Thanks!

## 2018-05-01 ENCOUNTER — Ambulatory Visit (INDEPENDENT_AMBULATORY_CARE_PROVIDER_SITE_OTHER): Payer: BLUE CROSS/BLUE SHIELD | Admitting: Neurology

## 2018-05-01 DIAGNOSIS — G43709 Chronic migraine without aura, not intractable, without status migrainosus: Secondary | ICD-10-CM

## 2018-05-01 MED ORDER — ONABOTULINUMTOXINA 100 UNITS IJ SOLR
200.0000 [IU] | Freq: Once | INTRAMUSCULAR | Status: AC
  Administered 2018-05-01: 200 [IU] via INTRAMUSCULAR

## 2018-05-01 NOTE — Progress Notes (Signed)
[facility]   Procedure Note Botox  Attending: Dr. Navah Grondin  Preoperative Diagnosis(es): Chronic migraine  Consent obtained from: The patient. Benefits discussed included, but were not limited to decreased muscle tightness, increased joint range of motion, and decreased pain.  Risk discussed included, but were not limited pain and discomfort, bleeding, bruising, excessive weakness, venous thrombosis, muscle atrophy and dysphagia.  Anticipated outcomes of the procedure as well as he risks and benefits of the alternatives to the procedure, and the roles and tasks of the personnel to be involved, were discussed with the patient, and the patient consents to the procedure and agrees to proceed. A copy of the patient medication guide was given to the patient which explains the blackbox warning.  Patients identity and treatment sites confirmed:  Yes.  Details of Procedure: Skin was cleaned with alcohol. Prior to injection, the needle plunger was aspirated to make sure the needle was not within a blood vessel.  There was no blood retrieved on aspiration.    Following is a summary of the muscles injected  And the amount of Botulinum toxin used:  Dilution 200 units of Botox was reconstituted with 4 ml of preservative free normal saline. Time of reconstitution: At the time of the office visit (<30 minutes prior to injection)   Injections  155 total units of Botox was injected with a 30 gauge needle.  Injection Sites: L occipitalis: 15 units- 3 sites  R occiptalis: 15 units- 3 sites  L upper trapezius: 15 units- 3 sites R upper trapezius: 15 units- 3 sits          L paraspinal: 10 units- 2 sites R paraspinal: 10 units- 2 sites  Face L frontalis(2 injection sites):10 units   R frontalis(2 injection sites):10 units         L corrugator: 5 units   R corrugator: 5 units           Procerus: 5 units   L temporalis: 20 units R temporalis: 20 units   Agent:  200 units of botulinum Type A  (Onobotulinum Toxin type A) was reconstituted with 4 ml of preservative free normal saline.  Time of reconstitution: At the time of the office visit (<30 minutes prior to injection)     Total injected (Units): 155  Total wasted (Units): 45  Patient tolerated procedure well without complications.   Reinjection is anticipated in 3 months. Return to clinic in 4.5 months.   

## 2018-07-22 DIAGNOSIS — L309 Dermatitis, unspecified: Secondary | ICD-10-CM | POA: Diagnosis not present

## 2018-07-26 DIAGNOSIS — E039 Hypothyroidism, unspecified: Secondary | ICD-10-CM | POA: Diagnosis not present

## 2018-07-31 ENCOUNTER — Ambulatory Visit: Payer: BLUE CROSS/BLUE SHIELD | Admitting: Neurology

## 2018-08-09 ENCOUNTER — Telehealth: Payer: Self-pay | Admitting: Neurology

## 2018-08-09 NOTE — Telephone Encounter (Signed)
Since the Pt has her Botox delivered, and we have rcvd it, you may schedule her for the next available 20 min and place her on the wait list. Thank you-

## 2018-08-09 NOTE — Telephone Encounter (Signed)
Patient is calling in about wanting to resch her Botox appt and it NOT wanting to wait until Dr.Jaffe's next botox day on 2/28. Please advise as to where we need to fit her in the schedule whenever Dr.Jaffe gets back. Thanks!

## 2018-08-22 ENCOUNTER — Ambulatory Visit (INDEPENDENT_AMBULATORY_CARE_PROVIDER_SITE_OTHER): Payer: BLUE CROSS/BLUE SHIELD | Admitting: Neurology

## 2018-08-22 DIAGNOSIS — G43709 Chronic migraine without aura, not intractable, without status migrainosus: Secondary | ICD-10-CM

## 2018-08-22 MED ORDER — ONABOTULINUMTOXINA 100 UNITS IJ SOLR
200.0000 [IU] | Freq: Once | INTRAMUSCULAR | Status: AC
  Administered 2018-08-22: 200 [IU] via INTRAMUSCULAR

## 2018-08-22 NOTE — Progress Notes (Signed)
[facility]   Procedure Note Botox  Attending: Dr. Eldor Conaway  Preoperative Diagnosis(es): Chronic migraine  Consent obtained from: The patient. Benefits discussed included, but were not limited to decreased muscle tightness, increased joint range of motion, and decreased pain.  Risk discussed included, but were not limited pain and discomfort, bleeding, bruising, excessive weakness, venous thrombosis, muscle atrophy and dysphagia.  Anticipated outcomes of the procedure as well as he risks and benefits of the alternatives to the procedure, and the roles and tasks of the personnel to be involved, were discussed with the patient, and the patient consents to the procedure and agrees to proceed. A copy of the patient medication guide was given to the patient which explains the blackbox warning.  Patients identity and treatment sites confirmed:  Yes.  Details of Procedure: Skin was cleaned with alcohol. Prior to injection, the needle plunger was aspirated to make sure the needle was not within a blood vessel.  There was no blood retrieved on aspiration.    Following is a summary of the muscles injected  And the amount of Botulinum toxin used:  Dilution 200 units of Botox was reconstituted with 4 ml of preservative free normal saline. Time of reconstitution: At the time of the office visit (<30 minutes prior to injection)   Injections  155 total units of Botox was injected with a 30 gauge needle.  Injection Sites: L occipitalis: 15 units- 3 sites  R occiptalis: 15 units- 3 sites  L upper trapezius: 15 units- 3 sites R upper trapezius: 15 units- 3 sits          L paraspinal: 10 units- 2 sites R paraspinal: 10 units- 2 sites  Face L frontalis(2 injection sites):10 units   R frontalis(2 injection sites):10 units         L corrugator: 5 units   R corrugator: 5 units           Procerus: 5 units   L temporalis: 20 units R temporalis: 20 units   Agent:  200 units of botulinum Type A  (Onobotulinum Toxin type A) was reconstituted with 4 ml of preservative free normal saline.  Time of reconstitution: At the time of the office visit (<30 minutes prior to injection)     Total injected (Units): 155  Total wasted (Units): 45  Patient tolerated procedure well without complications.   Reinjection is anticipated in 3 months. Return to clinic in 4.5 months.   

## 2018-08-29 DIAGNOSIS — R21 Rash and other nonspecific skin eruption: Secondary | ICD-10-CM | POA: Diagnosis not present

## 2018-09-16 ENCOUNTER — Ambulatory Visit: Payer: BLUE CROSS/BLUE SHIELD | Admitting: Neurology

## 2018-09-30 ENCOUNTER — Other Ambulatory Visit: Payer: Self-pay | Admitting: Neurology

## 2018-11-22 ENCOUNTER — Ambulatory Visit: Payer: BLUE CROSS/BLUE SHIELD | Admitting: Neurology

## 2018-12-12 ENCOUNTER — Telehealth: Payer: Self-pay

## 2018-12-12 NOTE — Telephone Encounter (Signed)
Called [facility] [phone], spoke with Dorathy Daft. Botox to be delivered 12/18/18

## 2018-12-20 ENCOUNTER — Ambulatory Visit (INDEPENDENT_AMBULATORY_CARE_PROVIDER_SITE_OTHER): Payer: BLUE CROSS/BLUE SHIELD | Admitting: Neurology

## 2018-12-20 ENCOUNTER — Other Ambulatory Visit: Payer: Self-pay

## 2018-12-20 DIAGNOSIS — G43709 Chronic migraine without aura, not intractable, without status migrainosus: Secondary | ICD-10-CM | POA: Diagnosis not present

## 2018-12-20 MED ORDER — ONABOTULINUMTOXINA 100 UNITS IJ SOLR
200.0000 [IU] | Freq: Once | INTRAMUSCULAR | Status: AC
  Administered 2018-12-20: 16:00:00 200 [IU] via INTRAMUSCULAR

## 2018-12-20 NOTE — Progress Notes (Signed)
[facility]   Procedure Note Botox  Attending: Dr. Jasson Siegmann  Preoperative Diagnosis(es): Chronic migraine  Consent obtained from: The patient Benefits discussed included, but were not limited to decreased muscle tightness, increased joint range of motion, and decreased pain.  Risk discussed included, but were not limited pain and discomfort, bleeding, bruising, excessive weakness, venous thrombosis, muscle atrophy and dysphagia.  Anticipated outcomes of the procedure as well as he risks and benefits of the alternatives to the procedure, and the roles and tasks of the personnel to be involved, were discussed with the patient, and the patient consents to the procedure and agrees to proceed. A copy of the patient medication guide was given to the patient which explains the blackbox warning.  Patients identity and treatment sites confirmed Yes.  .  Details of Procedure: Skin was cleaned with alcohol. Prior to injection, the needle plunger was aspirated to make sure the needle was not within a blood vessel.  There was no blood retrieved on aspiration.    Following is a summary of the muscles injected  And the amount of Botulinum toxin used:  Dilution 200 units of Botox was reconstituted with 4 ml of preservative free normal saline. Time of reconstitution: At the time of the office visit (<30 minutes prior to injection)   Injections  155 total units of Botox was injected with a 30 gauge needle.  Injection Sites: L occipitalis: 15 units- 3 sites  R occiptalis: 15 units- 3 sites  L upper trapezius: 15 units- 3 sites R upper trapezius: 15 units- 3 sits          L paraspinal: 10 units- 2 sites R paraspinal: 10 units- 2 sites  Face L frontalis(2 injection sites):10 units   R frontalis(2 injection sites):10 units         L corrugator: 5 units   R corrugator: 5 units           Procerus: 5 units   L temporalis: 20 units R temporalis: 20 units   Agent:  200 units of botulinum Type  A (Onobotulinum Toxin type A) was reconstituted with 4 ml of preservative free normal saline.  Time of reconstitution: At the time of the office visit (<30 minutes prior to injection)     Total injected (Units): 155  Total wasted (Units): None wasted.  Patient tolerated procedure well without complications.   Reinjection is anticipated in 3 months. Return to clinic in 4 1/2 months   

## 2018-12-30 ENCOUNTER — Ambulatory Visit: Payer: BLUE CROSS/BLUE SHIELD | Admitting: Neurology

## 2019-03-06 DIAGNOSIS — H43811 Vitreous degeneration, right eye: Secondary | ICD-10-CM | POA: Diagnosis not present

## 2019-03-17 ENCOUNTER — Encounter: Payer: Self-pay | Admitting: *Deleted

## 2019-03-17 NOTE — Progress Notes (Signed)
Working on Appeal 03/17/19/SRR   [place] Key: [id number] Need help? [phone]  Botox 200UNIT solution has been rejected by insurance.  Non-preferred agents typically have a higher patient co-pay than health insurance plan preferred agents. Please research and evaluate therapy options for this patient. You can learn more about alternative medications by calling [facility] at [phone] , or checking their online formulary.  Download a PDF of this information to discuss alternative therapies with the prescriber.

## 2019-03-17 NOTE — Progress Notes (Signed)
[facility] [place] Key: [id number] - Rx #: [id number] Need help? Call us at [phone]  Outcome  Additional Information Required  PA has already submitted and is in process for this patient and drug.;CYELYH:[id number];KKOECX:In Process;  DrugBotox 200UNIT solution  [employer] administrator PA Form

## 2019-03-18 NOTE — Progress Notes (Signed)
Received a call from [place] to call [facility] [phone] reference [phone] I called, spoke with Renee and received verbal approval valid 02/05/19-03/17/2020  Then called [phone] to verify it is being shipped. They said to expect it 03/19/19

## 2019-03-21 ENCOUNTER — Other Ambulatory Visit: Payer: Self-pay

## 2019-03-21 ENCOUNTER — Ambulatory Visit (INDEPENDENT_AMBULATORY_CARE_PROVIDER_SITE_OTHER): Payer: BC Managed Care – PPO | Admitting: Neurology

## 2019-03-21 DIAGNOSIS — G43709 Chronic migraine without aura, not intractable, without status migrainosus: Secondary | ICD-10-CM | POA: Diagnosis not present

## 2019-03-21 MED ORDER — ONABOTULINUMTOXINA 100 UNITS IJ SOLR
155.0000 [IU] | Freq: Once | INTRAMUSCULAR | Status: AC
  Administered 2019-03-21: 155 [IU] via INTRAMUSCULAR

## 2019-03-21 NOTE — Progress Notes (Signed)
[facility]  ° °Procedure Note Botox ° °Attending: Dr. Breydan Shillingburg ° °Preoperative Diagnosis(es): Chronic migraine ° °Consent obtained from: The patient °Benefits discussed included, but were not limited to decreased muscle tightness, increased joint range of motion, and decreased pain.  Risk discussed included, but were not limited pain and discomfort, bleeding, bruising, excessive weakness, venous thrombosis, muscle atrophy and dysphagia.  Anticipated outcomes of the procedure as well as he risks and benefits of the alternatives to the procedure, and the roles and tasks of the personnel to be involved, were discussed with the patient, and the patient consents to the procedure and agrees to proceed. A copy of the patient medication guide was given to the patient which explains the blackbox warning. ° °Patients identity and treatment sites confirmed Yes.  . ° °Details of Procedure: °Skin was cleaned with alcohol. Prior to injection, the needle plunger was aspirated to make sure the needle was not within a blood vessel.  There was no blood retrieved on aspiration.   ° °Following is a summary of the muscles injected  And the amount of Botulinum toxin used: ° °Dilution °200 units of Botox was reconstituted with 4 ml of preservative free normal saline. °Time of reconstitution: At the time of the office visit (<30 minutes prior to injection)  ° °Injections  °155 total units of Botox was injected with a 30 gauge needle. ° °Injection Sites: °L occipitalis: 15 units- 3 sites  °R occiptalis: 15 units- 3 sites ° °L upper trapezius: 15 units- 3 sites °R upper trapezius: 15 units- 3 sits          °L paraspinal: 10 units- 2 sites °R paraspinal: 10 units- 2 sites ° °Face °L frontalis(2 injection sites):10 units   °R frontalis(2 injection sites):10 units         °L corrugator: 5 units   °R corrugator: 5 units           °Procerus: 5 units   °L temporalis: 20 units °R temporalis: 20 units  ° °Agent:  °200 units of botulinum Type  A (Onobotulinum Toxin type A) was reconstituted with 4 ml of preservative free normal saline.  °Time of reconstitution: At the time of the office visit (<30 minutes prior to injection)  ° ° ° Total injected (Units):  155 ° Total wasted (Units):  45 ° °Patient tolerated procedure well without complications.   °Reinjection is anticipated in 3 months. ° ° °

## 2019-03-21 NOTE — Progress Notes (Signed)
Pt supplied medication 45 units wasted 

## 2019-03-25 ENCOUNTER — Ambulatory Visit: Payer: BLUE CROSS/BLUE SHIELD | Admitting: Neurology

## 2019-03-26 DIAGNOSIS — H43811 Vitreous degeneration, right eye: Secondary | ICD-10-CM | POA: Diagnosis not present
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
## 2019-04-18 ENCOUNTER — Encounter: Payer: Self-pay | Admitting: Neurology

## 2019-04-18 ENCOUNTER — Ambulatory Visit (INDEPENDENT_AMBULATORY_CARE_PROVIDER_SITE_OTHER): Payer: BC Managed Care – PPO | Admitting: Neurology

## 2019-04-18 ENCOUNTER — Other Ambulatory Visit: Payer: Self-pay

## 2019-04-18 VITALS — BP 123/74 | HR 82 | Temp 97.6°F | Resp 12 | Ht 63.0 in | Wt 131.0 lb

## 2019-04-18 DIAGNOSIS — G43009 Migraine without aura, not intractable, without status migrainosus: Secondary | ICD-10-CM

## 2019-06-10 DIAGNOSIS — Z131 Encounter for screening for diabetes mellitus: Secondary | ICD-10-CM | POA: Diagnosis not present

## 2019-06-10 DIAGNOSIS — G4700 Insomnia, unspecified: Secondary | ICD-10-CM | POA: Diagnosis not present

## 2019-06-10 DIAGNOSIS — E039 Hypothyroidism, unspecified: Secondary | ICD-10-CM | POA: Diagnosis not present

## 2019-06-10 DIAGNOSIS — M797 Fibromyalgia: Secondary | ICD-10-CM | POA: Diagnosis not present

## 2019-06-27 ENCOUNTER — Other Ambulatory Visit: Payer: Self-pay

## 2019-06-27 ENCOUNTER — Ambulatory Visit (INDEPENDENT_AMBULATORY_CARE_PROVIDER_SITE_OTHER): Payer: BC Managed Care – PPO | Admitting: Neurology

## 2019-06-27 DIAGNOSIS — G43009 Migraine without aura, not intractable, without status migrainosus: Secondary | ICD-10-CM | POA: Diagnosis not present

## 2019-06-27 MED ORDER — ONABOTULINUMTOXINA 100 UNITS IJ SOLR
155.0000 [IU] | Freq: Once | INTRAMUSCULAR | Status: AC
  Administered 2019-06-27: 155 [IU] via INTRAMUSCULAR

## 2019-06-27 NOTE — Progress Notes (Signed)
45 units wasted Patient supplied her own medication

## 2019-06-27 NOTE — Progress Notes (Signed)
[facility]  ° °Procedure Note Botox ° °Attending: Dr. Adam Jaffe ° °Preoperative Diagnosis(es): Chronic migraine ° °Consent obtained from: The patient °Benefits discussed included, but were not limited to decreased muscle tightness, increased joint range of motion, and decreased pain.  Risk discussed included, but were not limited pain and discomfort, bleeding, bruising, excessive weakness, venous thrombosis, muscle atrophy and dysphagia.  Anticipated outcomes of the procedure as well as he risks and benefits of the alternatives to the procedure, and the roles and tasks of the personnel to be involved, were discussed with the patient, and the patient consents to the procedure and agrees to proceed. A copy of the patient medication guide was given to the patient which explains the blackbox warning. ° °Patients identity and treatment sites confirmed Yes.  . ° °Details of Procedure: °Skin was cleaned with alcohol. Prior to injection, the needle plunger was aspirated to make sure the needle was not within a blood vessel.  There was no blood retrieved on aspiration.   ° °Following is a summary of the muscles injected  And the amount of Botulinum toxin used: ° °Dilution °200 units of Botox was reconstituted with 4 ml of preservative free normal saline. °Time of reconstitution: At the time of the office visit (<30 minutes prior to injection)  ° °Injections  °155 total units of Botox was injected with a 30 gauge needle. ° °Injection Sites: °L occipitalis: 15 units- 3 sites  °R occiptalis: 15 units- 3 sites ° °L upper trapezius: 15 units- 3 sites °R upper trapezius: 15 units- 3 sits          °L paraspinal: 10 units- 2 sites °R paraspinal: 10 units- 2 sites ° °Face °L frontalis(2 injection sites):10 units   °R frontalis(2 injection sites):10 units         °L corrugator: 5 units   °R corrugator: 5 units           °Procerus: 5 units   °L temporalis: 20 units °R temporalis: 20 units  ° °Agent:  °200 units of botulinum Type  A (Onobotulinum Toxin type A) was reconstituted with 4 ml of preservative free normal saline.  °Time of reconstitution: At the time of the office visit (<30 minutes prior to injection)  ° ° ° Total injected (Units):  155 ° Total wasted (Units):  45 ° °Patient tolerated procedure well without complications.   °Reinjection is anticipated in 3 months. ° ° °

## 2019-07-28 DIAGNOSIS — E559 Vitamin D deficiency, unspecified: Secondary | ICD-10-CM | POA: Diagnosis not present

## 2019-07-28 DIAGNOSIS — E039 Hypothyroidism, unspecified: Secondary | ICD-10-CM | POA: Diagnosis not present

## 2019-07-28 DIAGNOSIS — H04123 Dry eye syndrome of bilateral lacrimal glands: Secondary | ICD-10-CM | POA: Diagnosis not present

## 2019-07-28 DIAGNOSIS — Z0000 Encounter for general adult medical examination without abnormal findings: Secondary | ICD-10-CM | POA: Diagnosis not present

## 2019-07-28 DIAGNOSIS — Z13220 Encounter for screening for lipoid disorders: Secondary | ICD-10-CM | POA: Diagnosis not present

## 2019-08-06 ENCOUNTER — Ambulatory Visit: Payer: BC Managed Care – PPO | Attending: Internal Medicine

## 2019-08-06 DIAGNOSIS — Z20822 Contact with and (suspected) exposure to covid-19: Secondary | ICD-10-CM

## 2019-08-07 LAB — NOVEL CORONAVIRUS, NAA: SARS-CoV-2, NAA: NOT DETECTED

## 2019-10-10 ENCOUNTER — Ambulatory Visit: Payer: BC Managed Care – PPO | Admitting: Neurology

## 2019-10-10 ENCOUNTER — Other Ambulatory Visit: Payer: Self-pay

## 2019-10-10 ENCOUNTER — Ambulatory Visit (INDEPENDENT_AMBULATORY_CARE_PROVIDER_SITE_OTHER): Payer: BC Managed Care – PPO | Admitting: Neurology

## 2019-10-10 DIAGNOSIS — G43009 Migraine without aura, not intractable, without status migrainosus: Secondary | ICD-10-CM | POA: Diagnosis not present

## 2019-10-10 MED ORDER — ONABOTULINUMTOXINA 100 UNITS IJ SOLR
200.0000 [IU] | Freq: Once | INTRAMUSCULAR | Status: AC
  Administered 2019-10-10: 155 [IU] via INTRAMUSCULAR

## 2019-10-10 NOTE — Progress Notes (Signed)
[facility]  ° °Procedure Note Botox ° °Attending: Dr. Besse Miron ° °Preoperative Diagnosis(es): Chronic migraine ° °Consent obtained from: The patient °Benefits discussed included, but were not limited to decreased muscle tightness, increased joint range of motion, and decreased pain.  Risk discussed included, but were not limited pain and discomfort, bleeding, bruising, excessive weakness, venous thrombosis, muscle atrophy and dysphagia.  Anticipated outcomes of the procedure as well as he risks and benefits of the alternatives to the procedure, and the roles and tasks of the personnel to be involved, were discussed with the patient, and the patient consents to the procedure and agrees to proceed. A copy of the patient medication guide was given to the patient which explains the blackbox warning. ° °Patients identity and treatment sites confirmed Yes.  . ° °Details of Procedure: °Skin was cleaned with alcohol. Prior to injection, the needle plunger was aspirated to make sure the needle was not within a blood vessel.  There was no blood retrieved on aspiration.   ° °Following is a summary of the muscles injected  And the amount of Botulinum toxin used: ° °Dilution °200 units of Botox was reconstituted with 4 ml of preservative free normal saline. °Time of reconstitution: At the time of the office visit (<30 minutes prior to injection)  ° °Injections  °155 total units of Botox was injected with a 30 gauge needle. ° °Injection Sites: °L occipitalis: 15 units- 3 sites  °R occiptalis: 15 units- 3 sites ° °L upper trapezius: 15 units- 3 sites °R upper trapezius: 15 units- 3 sits          °L paraspinal: 10 units- 2 sites °R paraspinal: 10 units- 2 sites ° °Face °L frontalis(2 injection sites):10 units   °R frontalis(2 injection sites):10 units         °L corrugator: 5 units   °R corrugator: 5 units           °Procerus: 5 units   °L temporalis: 20 units °R temporalis: 20 units  ° °Agent:  °200 units of botulinum Type  A (Onobotulinum Toxin type A) was reconstituted with 4 ml of preservative free normal saline.  °Time of reconstitution: At the time of the office visit (<30 minutes prior to injection)  ° ° ° Total injected (Units):  155 ° Total wasted (Units):  45 ° °Patient tolerated procedure well without complications.   °Reinjection is anticipated in 3 months. ° ° °

## 2019-10-30 ENCOUNTER — Ambulatory Visit: Payer: BC Managed Care – PPO | Attending: Internal Medicine

## 2019-10-30 DIAGNOSIS — Z23 Encounter for immunization: Secondary | ICD-10-CM

## 2019-10-30 NOTE — Progress Notes (Signed)
   Covid-19 Vaccination Clinic  Name:  Kaitlin Moses    MRN: [id number] DOB: 07-29-1960  10/30/2019  [name] was observed post Covid-19 immunization for 15 minutes without incident. She was provided with Vaccine Information Sheet and instruction to access the V-Safe system.   [name] was instructed to call 911 with any severe reactions post vaccine: Marland Kitchen Difficulty breathing  . Swelling of face and throat  . A fast heartbeat  . A bad rash all over body  . Dizziness and weakness   Immunizations Administered    Name Date Dose VIS Date Route   Pfizer COVID-19 Vaccine 10/30/2019  4:02 PM 0.3 mL 07/04/2019 Intramuscular   Manufacturer: [employer], [employer]   Lot: [id number]   NDC: [address]-3

## 2019-11-03 ENCOUNTER — Telehealth: Payer: Self-pay | Admitting: Neurology

## 2019-11-03 ENCOUNTER — Telehealth: Payer: Self-pay

## 2019-11-03 DIAGNOSIS — G43709 Chronic migraine without aura, not intractable, without status migrainosus: Secondary | ICD-10-CM

## 2019-11-03 MED ORDER — BOTOX 200 UNITS IJ SOLR: INTRAMUSCULAR | 3 refills | Status: DC

## 2019-11-03 NOTE — Telephone Encounter (Signed)
Telephone call to [facility],Per rep need a new order for Botox sent over last script written 09/30/2018.   Verbal order given by Dr. Everlena Cooper to send a new script over.

## 2019-11-03 NOTE — Telephone Encounter (Signed)
The following message was left with [employer] on 10/31/19 at 6:48 PM.  The pharmacy is requesting Botox 200 units. Attempt made to contact pharmacy. Pharmacy requesting prescribing provider's NPI #. This nurse does not know who prescribed this medication, and therefore does not know NPI #.

## 2019-11-24 ENCOUNTER — Ambulatory Visit: Payer: BC Managed Care – PPO | Attending: Internal Medicine

## 2019-11-24 DIAGNOSIS — Z23 Encounter for immunization: Secondary | ICD-10-CM

## 2019-11-24 NOTE — Progress Notes (Signed)
   Covid-19 Vaccination Clinic  Name:  Irania Durell    MRN: [id number] DOB: 1961-06-09  11/24/2019  Kaitlin Moses was observed post Covid-19 immunization for 15 minutes without incident. She was provided with Vaccine Information Sheet and instruction to access the V-Safe system.   Kaitlin Moses was instructed to call 911 with any severe reactions post vaccine: Marland Kitchen Difficulty breathing  . Swelling of face and throat  . A fast heartbeat  . A bad rash all over body  . Dizziness and weakness   Immunizations Administered    Name Date Dose VIS Date Route   Pfizer COVID-19 Vaccine 11/24/2019  4:05 PM 0.3 mL 09/17/2018 Intramuscular   Manufacturer: [employer], [employer]   Lot: [id number]   NDC: [address]-7

## 2020-01-09 ENCOUNTER — Other Ambulatory Visit: Payer: Self-pay

## 2020-01-09 ENCOUNTER — Ambulatory Visit (INDEPENDENT_AMBULATORY_CARE_PROVIDER_SITE_OTHER): Payer: BC Managed Care – PPO | Admitting: Neurology

## 2020-01-09 DIAGNOSIS — G43009 Migraine without aura, not intractable, without status migrainosus: Secondary | ICD-10-CM | POA: Diagnosis not present

## 2020-01-09 MED ORDER — ONABOTULINUMTOXINA 100 UNITS IJ SOLR
200.0000 [IU] | Freq: Once | INTRAMUSCULAR | Status: AC
  Administered 2020-01-09: 155 [IU] via INTRAMUSCULAR

## 2020-01-09 NOTE — Progress Notes (Signed)
[facility]   Procedure Note Botox  Attending: Dr. Shon Millet  Preoperative Diagnosis(es): Chronic migraine  Consent obtained from: Patient Benefits discussed included, but were not limited to decreased muscle tightness, increased joint range of motion, and decreased pain.  Risk discussed included, but were not limited pain and discomfort, bleeding, bruising, excessive weakness, venous thrombosis, muscle atrophy and dysphagia.  Anticipated outcomes of the procedure as well as he risks and benefits of the alternatives to the procedure, and the roles and tasks of the personnel to be involved, were discussed with the patient, and the patient consents to the procedure and agrees to proceed. A copy of the patient medication guide was given to the patient which explains the blackbox warning.  Patients identity and treatment sites confirmed: yes.  Details of Procedure: Skin was cleaned with alcohol. Prior to injection, the needle plunger was aspirated to make sure the needle was not within a blood vessel.  There was no blood retrieved on aspiration.    Following is a summary of the muscles injected  And the amount of Botulinum toxin used:  Dilution 200 units of Botox was reconstituted with 4 ml of preservative free normal saline. Time of reconstitution: At the time of the office visit (<30 minutes prior to injection)   Injections  155 total units of Botox was injected with a 30 gauge needle.  Injection Sites: L occipitalis: 15 units- 3 sites  R occiptalis: 15 units- 3 sites  L upper trapezius: 15 units- 3 sites R upper trapezius: 15 units- 3 sits          L paraspinal: 10 units- 2 sites R paraspinal: 10 units- 2 sites  Face L frontalis(2 injection sites):10 units   R frontalis(2 injection sites):10 units         L corrugator: 5 units   R corrugator: 5 units           Procerus: 5 units   L temporalis: 20 units R temporalis: 20 units   Agent:  200 units of botulinum Type A  (Onobotulinum Toxin type A) was reconstituted with 4 ml of preservative free normal saline.  Time of reconstitution: At the time of the office visit (<30 minutes prior to injection)     Total injected (Units): 155  Total wasted (Units): 45  Patient tolerated procedure well without complications.   Reinjection is anticipated in 3 months.

## 2020-01-19 DIAGNOSIS — D225 Melanocytic nevi of trunk: Secondary | ICD-10-CM | POA: Diagnosis not present

## 2020-01-19 DIAGNOSIS — D2272 Melanocytic nevi of left lower limb, including hip: Secondary | ICD-10-CM | POA: Diagnosis not present

## 2020-01-19 DIAGNOSIS — D2261 Melanocytic nevi of right upper limb, including shoulder: Secondary | ICD-10-CM | POA: Diagnosis not present

## 2020-01-19 DIAGNOSIS — L821 Other seborrheic keratosis: Secondary | ICD-10-CM | POA: Diagnosis not present

## 2020-01-21 ENCOUNTER — Telehealth: Payer: Self-pay | Admitting: Neurology

## 2020-01-21 NOTE — Telephone Encounter (Signed)
LMOVM for pt to call us back.

## 2020-01-21 NOTE — Telephone Encounter (Signed)
Pt advised of note.

## 2020-01-21 NOTE — Telephone Encounter (Signed)
Sometimes that may happen.  There is nothing to do.  It should resolve, usually in 4 to 6 weeks.  Next time, I will try to inject a little higher above the eyebrow.

## 2020-01-21 NOTE — Telephone Encounter (Signed)
Patient called and states that she had her Botox injection 01-09-20. She states that one Eye brow is higher than the other. She states that she "looks weird" she needs to know what she needs to do

## 2020-01-21 NOTE — Telephone Encounter (Signed)
Please advise 

## 2020-04-05 DIAGNOSIS — M797 Fibromyalgia: Secondary | ICD-10-CM | POA: Diagnosis not present

## 2020-04-09 ENCOUNTER — Other Ambulatory Visit: Payer: Self-pay

## 2020-04-09 ENCOUNTER — Ambulatory Visit (INDEPENDENT_AMBULATORY_CARE_PROVIDER_SITE_OTHER): Payer: BC Managed Care – PPO | Admitting: Neurology

## 2020-04-09 DIAGNOSIS — G43709 Chronic migraine without aura, not intractable, without status migrainosus: Secondary | ICD-10-CM | POA: Diagnosis not present

## 2020-04-09 MED ORDER — ONABOTULINUMTOXINA 100 UNITS IJ SOLR
200.0000 [IU] | Freq: Once | INTRAMUSCULAR | Status: AC
  Administered 2020-04-09: 155 [IU] via INTRAMUSCULAR

## 2020-04-09 NOTE — Progress Notes (Signed)
[facility]  ° °Procedure Note Botox ° °Attending: Dr. Wilhelmenia Addis ° °Preoperative Diagnosis(es): Chronic migraine ° °Consent obtained from: The patient °Benefits discussed included, but were not limited to decreased muscle tightness, increased joint range of motion, and decreased pain.  Risk discussed included, but were not limited pain and discomfort, bleeding, bruising, excessive weakness, venous thrombosis, muscle atrophy and dysphagia.  Anticipated outcomes of the procedure as well as he risks and benefits of the alternatives to the procedure, and the roles and tasks of the personnel to be involved, were discussed with the patient, and the patient consents to the procedure and agrees to proceed. A copy of the patient medication guide was given to the patient which explains the blackbox warning. ° °Patients identity and treatment sites confirmed Yes.  . ° °Details of Procedure: °Skin was cleaned with alcohol. Prior to injection, the needle plunger was aspirated to make sure the needle was not within a blood vessel.  There was no blood retrieved on aspiration.   ° °Following is a summary of the muscles injected  And the amount of Botulinum toxin used: ° °Dilution °200 units of Botox was reconstituted with 4 ml of preservative free normal saline. °Time of reconstitution: At the time of the office visit (<30 minutes prior to injection)  ° °Injections  °155 total units of Botox was injected with a 30 gauge needle. ° °Injection Sites: °L occipitalis: 15 units- 3 sites  °R occiptalis: 15 units- 3 sites ° °L upper trapezius: 15 units- 3 sites °R upper trapezius: 15 units- 3 sits          °L paraspinal: 10 units- 2 sites °R paraspinal: 10 units- 2 sites ° °Face °L frontalis(2 injection sites):10 units   °R frontalis(2 injection sites):10 units         °L corrugator: 5 units   °R corrugator: 5 units           °Procerus: 5 units   °L temporalis: 20 units °R temporalis: 20 units  ° °Agent:  °200 units of botulinum Type  A (Onobotulinum Toxin type A) was reconstituted with 4 ml of preservative free normal saline.  °Time of reconstitution: At the time of the office visit (<30 minutes prior to injection)  ° ° ° Total injected (Units): 155 ° Total wasted (Units): 20 ° °Patient tolerated procedure well without complications.   °Reinjection is anticipated in 3 months. ° ° ° °

## 2020-04-19 DIAGNOSIS — Z1231 Encounter for screening mammogram for malignant neoplasm of breast: Secondary | ICD-10-CM | POA: Diagnosis not present

## 2020-05-04 ENCOUNTER — Encounter: Payer: Self-pay | Admitting: Neurology

## 2020-05-04 NOTE — Progress Notes (Addendum)
[facility] [place]: [id number] - PA Case ID: [id number] - Rx #: [id number] Need help? Call us at [phone] Outcome Approvedon October 13 CaseId:[id number];Status:Approved;Review Type:Prior Auth;Coverage Start Date:04/04/2020;Coverage End Date:05/05/2021; Drug Botox 200UNIT solution Form [employer] Electronic PA Form (2017 NCPDP)  Received fax from [facility] for [facility] with [facility].   Patient uses [facility] for Botox even though BV states otherwise.

## 2020-05-18 ENCOUNTER — Ambulatory Visit: Payer: BC Managed Care – PPO | Admitting: Neurology
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
## 2020-05-24 ENCOUNTER — Ambulatory Visit: Payer: BC Managed Care – PPO | Admitting: Neurology

## 2020-05-24 ENCOUNTER — Encounter: Payer: Self-pay | Admitting: Neurology

## 2020-05-28 NOTE — Progress Notes (Signed)
Received a call from [facility] saying they are working on PA- told Rep I already received approval. She had me fax over all approval info to them so they could validate it through insurance.

## 2020-05-31 ENCOUNTER — Telehealth: Payer: Self-pay | Admitting: Neurology

## 2020-05-31 NOTE — Telephone Encounter (Signed)
Patient called in after being sick and having to cancel her 05/24/20 appt. I scheduled her follow up for 11/09/20. She wants to make sure it's ok for her not to be seen for a follow up before her next Botox on 07/09/20?

## 2020-05-31 NOTE — Telephone Encounter (Signed)
Pt advised.

## 2020-05-31 NOTE — Telephone Encounter (Signed)
That's okay

## 2020-06-01 DIAGNOSIS — F4320 Adjustment disorder, unspecified: Secondary | ICD-10-CM | POA: Diagnosis not present

## 2020-06-03 DIAGNOSIS — M9901 Segmental and somatic dysfunction of cervical region: Secondary | ICD-10-CM | POA: Diagnosis not present

## 2020-06-03 DIAGNOSIS — M531 Cervicobrachial syndrome: Secondary | ICD-10-CM | POA: Diagnosis not present

## 2020-06-03 DIAGNOSIS — M5136 Other intervertebral disc degeneration, lumbar region: Secondary | ICD-10-CM | POA: Diagnosis not present

## 2020-06-03 DIAGNOSIS — M5032 Other cervical disc degeneration, mid-cervical region, unspecified level: Secondary | ICD-10-CM | POA: Diagnosis not present

## 2020-06-07 DIAGNOSIS — M531 Cervicobrachial syndrome: Secondary | ICD-10-CM | POA: Diagnosis not present

## 2020-06-07 DIAGNOSIS — M5136 Other intervertebral disc degeneration, lumbar region: Secondary | ICD-10-CM | POA: Diagnosis not present

## 2020-06-07 DIAGNOSIS — M9901 Segmental and somatic dysfunction of cervical region: Secondary | ICD-10-CM | POA: Diagnosis not present

## 2020-06-07 DIAGNOSIS — M5032 Other cervical disc degeneration, mid-cervical region, unspecified level: Secondary | ICD-10-CM | POA: Diagnosis not present

## 2020-06-08 DIAGNOSIS — F4320 Adjustment disorder, unspecified: Secondary | ICD-10-CM | POA: Diagnosis not present

## 2020-06-10 DIAGNOSIS — M5136 Other intervertebral disc degeneration, lumbar region: Secondary | ICD-10-CM | POA: Diagnosis not present

## 2020-06-10 DIAGNOSIS — M531 Cervicobrachial syndrome: Secondary | ICD-10-CM | POA: Diagnosis not present

## 2020-06-10 DIAGNOSIS — M5032 Other cervical disc degeneration, mid-cervical region, unspecified level: Secondary | ICD-10-CM | POA: Diagnosis not present

## 2020-06-10 DIAGNOSIS — M9901 Segmental and somatic dysfunction of cervical region: Secondary | ICD-10-CM | POA: Diagnosis not present

## 2020-06-14 DIAGNOSIS — M531 Cervicobrachial syndrome: Secondary | ICD-10-CM | POA: Diagnosis not present

## 2020-06-14 DIAGNOSIS — M5136 Other intervertebral disc degeneration, lumbar region: Secondary | ICD-10-CM | POA: Diagnosis not present

## 2020-06-14 DIAGNOSIS — M9901 Segmental and somatic dysfunction of cervical region: Secondary | ICD-10-CM | POA: Diagnosis not present

## 2020-06-14 DIAGNOSIS — M5032 Other cervical disc degeneration, mid-cervical region, unspecified level: Secondary | ICD-10-CM | POA: Diagnosis not present

## 2020-06-15 DIAGNOSIS — F4323 Adjustment disorder with mixed anxiety and depressed mood: Secondary | ICD-10-CM | POA: Diagnosis not present

## 2020-06-16 DIAGNOSIS — M5032 Other cervical disc degeneration, mid-cervical region, unspecified level: Secondary | ICD-10-CM | POA: Diagnosis not present

## 2020-06-16 DIAGNOSIS — M9901 Segmental and somatic dysfunction of cervical region: Secondary | ICD-10-CM | POA: Diagnosis not present

## 2020-06-16 DIAGNOSIS — M5136 Other intervertebral disc degeneration, lumbar region: Secondary | ICD-10-CM | POA: Diagnosis not present

## 2020-06-16 DIAGNOSIS — M531 Cervicobrachial syndrome: Secondary | ICD-10-CM | POA: Diagnosis not present

## 2020-06-21 DIAGNOSIS — M5032 Other cervical disc degeneration, mid-cervical region, unspecified level: Secondary | ICD-10-CM | POA: Diagnosis not present

## 2020-06-21 DIAGNOSIS — M5136 Other intervertebral disc degeneration, lumbar region: Secondary | ICD-10-CM | POA: Diagnosis not present

## 2020-06-21 DIAGNOSIS — M531 Cervicobrachial syndrome: Secondary | ICD-10-CM | POA: Diagnosis not present

## 2020-06-21 DIAGNOSIS — M9901 Segmental and somatic dysfunction of cervical region: Secondary | ICD-10-CM | POA: Diagnosis not present

## 2020-06-22 DIAGNOSIS — F4323 Adjustment disorder with mixed anxiety and depressed mood: Secondary | ICD-10-CM | POA: Diagnosis not present

## 2020-06-25 DIAGNOSIS — M531 Cervicobrachial syndrome: Secondary | ICD-10-CM | POA: Diagnosis not present

## 2020-06-25 DIAGNOSIS — M9901 Segmental and somatic dysfunction of cervical region: Secondary | ICD-10-CM | POA: Diagnosis not present

## 2020-06-25 DIAGNOSIS — M5032 Other cervical disc degeneration, mid-cervical region, unspecified level: Secondary | ICD-10-CM | POA: Diagnosis not present

## 2020-06-25 DIAGNOSIS — M5136 Other intervertebral disc degeneration, lumbar region: Secondary | ICD-10-CM | POA: Diagnosis not present

## 2020-06-28 DIAGNOSIS — M531 Cervicobrachial syndrome: Secondary | ICD-10-CM | POA: Diagnosis not present

## 2020-06-28 DIAGNOSIS — M5032 Other cervical disc degeneration, mid-cervical region, unspecified level: Secondary | ICD-10-CM | POA: Diagnosis not present

## 2020-06-28 DIAGNOSIS — M9901 Segmental and somatic dysfunction of cervical region: Secondary | ICD-10-CM | POA: Diagnosis not present

## 2020-06-28 DIAGNOSIS — M5136 Other intervertebral disc degeneration, lumbar region: Secondary | ICD-10-CM | POA: Diagnosis not present

## 2020-07-01 DIAGNOSIS — F4323 Adjustment disorder with mixed anxiety and depressed mood: Secondary | ICD-10-CM | POA: Diagnosis not present

## 2020-07-01 DIAGNOSIS — M5136 Other intervertebral disc degeneration, lumbar region: Secondary | ICD-10-CM | POA: Diagnosis not present

## 2020-07-01 DIAGNOSIS — M5032 Other cervical disc degeneration, mid-cervical region, unspecified level: Secondary | ICD-10-CM | POA: Diagnosis not present

## 2020-07-01 DIAGNOSIS — M9901 Segmental and somatic dysfunction of cervical region: Secondary | ICD-10-CM | POA: Diagnosis not present

## 2020-07-01 DIAGNOSIS — M531 Cervicobrachial syndrome: Secondary | ICD-10-CM | POA: Diagnosis not present

## 2020-07-06 DIAGNOSIS — M5032 Other cervical disc degeneration, mid-cervical region, unspecified level: Secondary | ICD-10-CM | POA: Diagnosis not present

## 2020-07-06 DIAGNOSIS — M5136 Other intervertebral disc degeneration, lumbar region: Secondary | ICD-10-CM | POA: Diagnosis not present

## 2020-07-06 DIAGNOSIS — M9901 Segmental and somatic dysfunction of cervical region: Secondary | ICD-10-CM | POA: Diagnosis not present

## 2020-07-06 DIAGNOSIS — M531 Cervicobrachial syndrome: Secondary | ICD-10-CM | POA: Diagnosis not present

## 2020-07-08 DIAGNOSIS — F4323 Adjustment disorder with mixed anxiety and depressed mood: Secondary | ICD-10-CM | POA: Diagnosis not present

## 2020-07-09 ENCOUNTER — Ambulatory Visit (INDEPENDENT_AMBULATORY_CARE_PROVIDER_SITE_OTHER): Payer: BC Managed Care – PPO | Admitting: Neurology

## 2020-07-09 ENCOUNTER — Other Ambulatory Visit: Payer: Self-pay

## 2020-07-09 DIAGNOSIS — G43709 Chronic migraine without aura, not intractable, without status migrainosus: Secondary | ICD-10-CM

## 2020-07-09 MED ORDER — ONABOTULINUMTOXINA 100 UNITS IJ SOLR
200.0000 [IU] | Freq: Once | INTRAMUSCULAR | Status: AC
  Administered 2020-07-09: 200 [IU] via INTRAMUSCULAR

## 2020-07-09 NOTE — Progress Notes (Signed)
[facility]  ° °Procedure Note Botox ° °Attending: Dr. Salli Bodin ° °Preoperative Diagnosis(es): Chronic migraine ° °Consent obtained from: The patient °Benefits discussed included, but were not limited to decreased muscle tightness, increased joint range of motion, and decreased pain.  Risk discussed included, but were not limited pain and discomfort, bleeding, bruising, excessive weakness, venous thrombosis, muscle atrophy and dysphagia.  Anticipated outcomes of the procedure as well as he risks and benefits of the alternatives to the procedure, and the roles and tasks of the personnel to be involved, were discussed with the patient, and the patient consents to the procedure and agrees to proceed. A copy of the patient medication guide was given to the patient which explains the blackbox warning. ° °Patients identity and treatment sites confirmed Yes.  . ° °Details of Procedure: °Skin was cleaned with alcohol. Prior to injection, the needle plunger was aspirated to make sure the needle was not within a blood vessel.  There was no blood retrieved on aspiration.   ° °Following is a summary of the muscles injected  And the amount of Botulinum toxin used: ° °Dilution °200 units of Botox was reconstituted with 4 ml of preservative free normal saline. °Time of reconstitution: At the time of the office visit (<30 minutes prior to injection)  ° °Injections  °155 total units of Botox was injected with a 30 gauge needle. ° °Injection Sites: °L occipitalis: 15 units- 3 sites  °R occiptalis: 15 units- 3 sites ° °L upper trapezius: 15 units- 3 sites °R upper trapezius: 15 units- 3 sits          °L paraspinal: 10 units- 2 sites °R paraspinal: 10 units- 2 sites ° °Face °L frontalis(2 injection sites):10 units   °R frontalis(2 injection sites):10 units         °L corrugator: 5 units   °R corrugator: 5 units           °Procerus: 5 units   °L temporalis: 20 units °R temporalis: 20 units  ° °Agent:  °200 units of botulinum Type  A (Onobotulinum Toxin type A) was reconstituted with 4 ml of preservative free normal saline.  °Time of reconstitution: At the time of the office visit (<30 minutes prior to injection)  ° ° ° Total injected (Units):  155 ° Total wasted (Units):  45 ° °Patient tolerated procedure well without complications.   °Reinjection is anticipated in 3 months. ° ° °

## 2020-07-12 DIAGNOSIS — M9901 Segmental and somatic dysfunction of cervical region: Secondary | ICD-10-CM | POA: Diagnosis not present

## 2020-07-12 DIAGNOSIS — M5136 Other intervertebral disc degeneration, lumbar region: Secondary | ICD-10-CM | POA: Diagnosis not present

## 2020-07-12 DIAGNOSIS — M5032 Other cervical disc degeneration, mid-cervical region, unspecified level: Secondary | ICD-10-CM | POA: Diagnosis not present

## 2020-07-12 DIAGNOSIS — M531 Cervicobrachial syndrome: Secondary | ICD-10-CM | POA: Diagnosis not present

## 2020-07-14 DIAGNOSIS — F4323 Adjustment disorder with mixed anxiety and depressed mood: Secondary | ICD-10-CM | POA: Diagnosis not present

## 2020-07-19 DIAGNOSIS — M5032 Other cervical disc degeneration, mid-cervical region, unspecified level: Secondary | ICD-10-CM | POA: Diagnosis not present

## 2020-07-19 DIAGNOSIS — M9901 Segmental and somatic dysfunction of cervical region: Secondary | ICD-10-CM | POA: Diagnosis not present

## 2020-07-19 DIAGNOSIS — M531 Cervicobrachial syndrome: Secondary | ICD-10-CM | POA: Diagnosis not present

## 2020-07-19 DIAGNOSIS — M5136 Other intervertebral disc degeneration, lumbar region: Secondary | ICD-10-CM | POA: Diagnosis not present

## 2020-07-26 DIAGNOSIS — M531 Cervicobrachial syndrome: Secondary | ICD-10-CM | POA: Diagnosis not present

## 2020-07-26 DIAGNOSIS — M5032 Other cervical disc degeneration, mid-cervical region, unspecified level: Secondary | ICD-10-CM | POA: Diagnosis not present

## 2020-07-26 DIAGNOSIS — M9901 Segmental and somatic dysfunction of cervical region: Secondary | ICD-10-CM | POA: Diagnosis not present

## 2020-07-26 DIAGNOSIS — M5136 Other intervertebral disc degeneration, lumbar region: Secondary | ICD-10-CM | POA: Diagnosis not present

## 2020-08-02 DIAGNOSIS — M9901 Segmental and somatic dysfunction of cervical region: Secondary | ICD-10-CM | POA: Diagnosis not present

## 2020-08-02 DIAGNOSIS — M5032 Other cervical disc degeneration, mid-cervical region, unspecified level: Secondary | ICD-10-CM | POA: Diagnosis not present

## 2020-08-02 DIAGNOSIS — M531 Cervicobrachial syndrome: Secondary | ICD-10-CM | POA: Diagnosis not present

## 2020-08-02 DIAGNOSIS — M5136 Other intervertebral disc degeneration, lumbar region: Secondary | ICD-10-CM | POA: Diagnosis not present

## 2020-08-05 DIAGNOSIS — F4323 Adjustment disorder with mixed anxiety and depressed mood: Secondary | ICD-10-CM | POA: Diagnosis not present

## 2020-08-16 DIAGNOSIS — M531 Cervicobrachial syndrome: Secondary | ICD-10-CM | POA: Diagnosis not present

## 2020-08-16 DIAGNOSIS — M5136 Other intervertebral disc degeneration, lumbar region: Secondary | ICD-10-CM | POA: Diagnosis not present

## 2020-08-16 DIAGNOSIS — M9901 Segmental and somatic dysfunction of cervical region: Secondary | ICD-10-CM | POA: Diagnosis not present

## 2020-08-16 DIAGNOSIS — M5032 Other cervical disc degeneration, mid-cervical region, unspecified level: Secondary | ICD-10-CM | POA: Diagnosis not present

## 2020-08-23 DIAGNOSIS — M9901 Segmental and somatic dysfunction of cervical region: Secondary | ICD-10-CM | POA: Diagnosis not present

## 2020-08-23 DIAGNOSIS — M5032 Other cervical disc degeneration, mid-cervical region, unspecified level: Secondary | ICD-10-CM | POA: Diagnosis not present

## 2020-08-23 DIAGNOSIS — M531 Cervicobrachial syndrome: Secondary | ICD-10-CM | POA: Diagnosis not present

## 2020-08-23 DIAGNOSIS — M5136 Other intervertebral disc degeneration, lumbar region: Secondary | ICD-10-CM | POA: Diagnosis not present

## 2020-09-01 DIAGNOSIS — F4323 Adjustment disorder with mixed anxiety and depressed mood: Secondary | ICD-10-CM | POA: Diagnosis not present

## 2020-09-06 DIAGNOSIS — F4323 Adjustment disorder with mixed anxiety and depressed mood: Secondary | ICD-10-CM | POA: Diagnosis not present

## 2020-09-06 DIAGNOSIS — M531 Cervicobrachial syndrome: Secondary | ICD-10-CM | POA: Diagnosis not present

## 2020-09-06 DIAGNOSIS — M5032 Other cervical disc degeneration, mid-cervical region, unspecified level: Secondary | ICD-10-CM | POA: Diagnosis not present

## 2020-09-06 DIAGNOSIS — M9901 Segmental and somatic dysfunction of cervical region: Secondary | ICD-10-CM | POA: Diagnosis not present

## 2020-09-06 DIAGNOSIS — M5136 Other intervertebral disc degeneration, lumbar region: Secondary | ICD-10-CM | POA: Diagnosis not present

## 2020-09-21 DIAGNOSIS — T8544XA Capsular contracture of breast implant, initial encounter: Secondary | ICD-10-CM | POA: Diagnosis not present

## 2020-10-08 ENCOUNTER — Ambulatory Visit: Payer: BC Managed Care – PPO | Admitting: Neurology

## 2020-10-14 DIAGNOSIS — R682 Dry mouth, unspecified: Secondary | ICD-10-CM | POA: Diagnosis not present

## 2020-10-14 DIAGNOSIS — M797 Fibromyalgia: Secondary | ICD-10-CM | POA: Diagnosis not present

## 2020-10-14 DIAGNOSIS — N898 Other specified noninflammatory disorders of vagina: Secondary | ICD-10-CM | POA: Diagnosis not present

## 2020-10-14 DIAGNOSIS — H04129 Dry eye syndrome of unspecified lacrimal gland: Secondary | ICD-10-CM | POA: Diagnosis not present

## 2020-10-21 DIAGNOSIS — Z961 Presence of intraocular lens: Secondary | ICD-10-CM | POA: Diagnosis not present

## 2020-10-21 DIAGNOSIS — H43813 Vitreous degeneration, bilateral: Secondary | ICD-10-CM | POA: Diagnosis not present

## 2020-10-21 DIAGNOSIS — H52222 Regular astigmatism, left eye: Secondary | ICD-10-CM | POA: Diagnosis not present

## 2020-10-21 DIAGNOSIS — H5212 Myopia, left eye: Secondary | ICD-10-CM | POA: Diagnosis not present

## 2020-10-21 DIAGNOSIS — H16223 Keratoconjunctivitis sicca, not specified as Sjogren's, bilateral: Secondary | ICD-10-CM | POA: Diagnosis not present

## 2020-10-21 DIAGNOSIS — H524 Presbyopia: Secondary | ICD-10-CM | POA: Diagnosis not present

## 2020-10-27 DIAGNOSIS — F4323 Adjustment disorder with mixed anxiety and depressed mood: Secondary | ICD-10-CM | POA: Diagnosis not present
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
## 2020-11-09 ENCOUNTER — Ambulatory Visit (INDEPENDENT_AMBULATORY_CARE_PROVIDER_SITE_OTHER): Payer: BC Managed Care – PPO | Admitting: Neurology

## 2020-11-09 ENCOUNTER — Encounter: Payer: Self-pay | Admitting: Neurology

## 2020-11-09 ENCOUNTER — Other Ambulatory Visit: Payer: Self-pay

## 2020-11-09 VITALS — BP 113/70 | HR 98 | Resp 20 | Ht 63.0 in | Wt 119.0 lb

## 2020-11-09 DIAGNOSIS — G43009 Migraine without aura, not intractable, without status migrainosus: Secondary | ICD-10-CM

## 2020-11-09 NOTE — Patient Instructions (Signed)
Follow up for Botox Limit use of pain relievers to no more than 2 days out of week to prevent risk of rebound or medication-overuse headache. Keep headache diary

## 2020-11-15 DIAGNOSIS — F4323 Adjustment disorder with mixed anxiety and depressed mood: Secondary | ICD-10-CM | POA: Diagnosis not present

## 2020-11-22 DIAGNOSIS — F4323 Adjustment disorder with mixed anxiety and depressed mood: Secondary | ICD-10-CM | POA: Diagnosis not present

## 2020-11-27 ENCOUNTER — Other Ambulatory Visit: Payer: Self-pay | Admitting: Neurology

## 2020-11-27 DIAGNOSIS — G43709 Chronic migraine without aura, not intractable, without status migrainosus: Secondary | ICD-10-CM

## 2020-12-03 ENCOUNTER — Other Ambulatory Visit: Payer: Self-pay

## 2020-12-03 ENCOUNTER — Ambulatory Visit (INDEPENDENT_AMBULATORY_CARE_PROVIDER_SITE_OTHER): Payer: BC Managed Care – PPO | Admitting: Neurology

## 2020-12-03 DIAGNOSIS — G43709 Chronic migraine without aura, not intractable, without status migrainosus: Secondary | ICD-10-CM

## 2020-12-03 MED ORDER — ONABOTULINUMTOXINA 100 UNITS IJ SOLR
200.0000 [IU] | Freq: Once | INTRAMUSCULAR | Status: AC
  Administered 2020-12-03: 155 [IU] via INTRAMUSCULAR

## 2020-12-03 NOTE — Progress Notes (Signed)
[facility]   Procedure Note Botox  Attending: Dr. Natasa Stigall  Preoperative Diagnosis(es): Chronic migraine  Consent obtained from: The patient Benefits discussed included, but were not limited to decreased muscle tightness, increased joint range of motion, and decreased pain.  Risk discussed included, but were not limited pain and discomfort, bleeding, bruising, excessive weakness, venous thrombosis, muscle atrophy and dysphagia.  Anticipated outcomes of the procedure as well as he risks and benefits of the alternatives to the procedure, and the roles and tasks of the personnel to be involved, were discussed with the patient, and the patient consents to the procedure and agrees to proceed. A copy of the patient medication guide was given to the patient which explains the blackbox warning.  Patients identity and treatment sites confirmed Yes.  .  Details of Procedure: Skin was cleaned with alcohol. Prior to injection, the needle plunger was aspirated to make sure the needle was not within a blood vessel.  There was no blood retrieved on aspiration.    Following is a summary of the muscles injected  And the amount of Botulinum toxin used:  Dilution 200 units of Botox was reconstituted with 4 ml of preservative free normal saline. Time of reconstitution: At the time of the office visit (<30 minutes prior to injection)   Injections  155 total units of Botox was injected with a 30 gauge needle.  Injection Sites: L occipitalis: 15 units- 3 sites  R occiptalis: 15 units- 3 sites  L upper trapezius: 15 units- 3 sites R upper trapezius: 15 units- 3 sits          L paraspinal: 10 units- 2 sites R paraspinal: 10 units- 2 sites  Face L frontalis(2 injection sites):10 units   R frontalis(2 injection sites):10 units         L corrugator: 5 units   R corrugator: 5 units           Procerus: 5 units   L temporalis: 20 units R temporalis: 20 units   Agent:  200 units of botulinum Type  A (Onobotulinum Toxin type A) was reconstituted with 4 ml of preservative free normal saline.  Time of reconstitution: At the time of the office visit (<30 minutes prior to injection)     Total injected (Units): 155  Total wasted (Units): none wasted  Patient tolerated procedure well without complications.   Reinjection is anticipated in 3 months.    

## 2020-12-06 DIAGNOSIS — F4323 Adjustment disorder with mixed anxiety and depressed mood: Secondary | ICD-10-CM | POA: Diagnosis not present

## 2020-12-13 DIAGNOSIS — F4323 Adjustment disorder with mixed anxiety and depressed mood: Secondary | ICD-10-CM | POA: Diagnosis not present

## 2020-12-16 ENCOUNTER — Ambulatory Visit: Payer: BC Managed Care – PPO | Admitting: Internal Medicine

## 2020-12-21 DIAGNOSIS — F4323 Adjustment disorder with mixed anxiety and depressed mood: Secondary | ICD-10-CM | POA: Diagnosis not present

## 2020-12-22 ENCOUNTER — Ambulatory Visit: Payer: BC Managed Care – PPO | Admitting: Internal Medicine

## 2020-12-28 DIAGNOSIS — F4323 Adjustment disorder with mixed anxiety and depressed mood: Secondary | ICD-10-CM | POA: Diagnosis not present

## 2021-01-04 DIAGNOSIS — F4323 Adjustment disorder with mixed anxiety and depressed mood: Secondary | ICD-10-CM | POA: Diagnosis not present

## 2021-01-13 DIAGNOSIS — F4323 Adjustment disorder with mixed anxiety and depressed mood: Secondary | ICD-10-CM | POA: Diagnosis not present

## 2021-01-19 DIAGNOSIS — F4323 Adjustment disorder with mixed anxiety and depressed mood: Secondary | ICD-10-CM | POA: Diagnosis not present

## 2021-01-25 DIAGNOSIS — R519 Headache, unspecified: Secondary | ICD-10-CM | POA: Diagnosis not present

## 2021-01-27 DIAGNOSIS — F4323 Adjustment disorder with mixed anxiety and depressed mood: Secondary | ICD-10-CM | POA: Diagnosis not present

## 2021-02-02 DIAGNOSIS — F4323 Adjustment disorder with mixed anxiety and depressed mood: Secondary | ICD-10-CM | POA: Diagnosis not present

## 2021-02-08 DIAGNOSIS — F4323 Adjustment disorder with mixed anxiety and depressed mood: Secondary | ICD-10-CM | POA: Diagnosis not present

## 2021-02-14 DIAGNOSIS — F4323 Adjustment disorder with mixed anxiety and depressed mood: Secondary | ICD-10-CM | POA: Diagnosis not present

## 2021-03-11 ENCOUNTER — Other Ambulatory Visit: Payer: Self-pay

## 2021-03-11 ENCOUNTER — Ambulatory Visit (INDEPENDENT_AMBULATORY_CARE_PROVIDER_SITE_OTHER): Payer: BC Managed Care – PPO | Admitting: Neurology

## 2021-03-11 DIAGNOSIS — G43709 Chronic migraine without aura, not intractable, without status migrainosus: Secondary | ICD-10-CM

## 2021-03-11 MED ORDER — ONABOTULINUMTOXINA 100 UNITS IJ SOLR
200.0000 [IU] | Freq: Once | INTRAMUSCULAR | Status: AC
  Administered 2021-03-11: 200 [IU] via INTRAMUSCULAR

## 2021-03-11 NOTE — Progress Notes (Signed)
[facility]  ° °Procedure Note Botox ° °Attending: Dr. Dresden Lozito ° °Preoperative Diagnosis(es): Chronic migraine ° °Consent obtained from: The patient °Benefits discussed included, but were not limited to decreased muscle tightness, increased joint range of motion, and decreased pain.  Risk discussed included, but were not limited pain and discomfort, bleeding, bruising, excessive weakness, venous thrombosis, muscle atrophy and dysphagia.  Anticipated outcomes of the procedure as well as he risks and benefits of the alternatives to the procedure, and the roles and tasks of the personnel to be involved, were discussed with the patient, and the patient consents to the procedure and agrees to proceed. A copy of the patient medication guide was given to the patient which explains the blackbox warning. ° °Patients identity and treatment sites confirmed Yes.  . ° °Details of Procedure: °Skin was cleaned with alcohol. Prior to injection, the needle plunger was aspirated to make sure the needle was not within a blood vessel.  There was no blood retrieved on aspiration.   ° °Following is a summary of the muscles injected  And the amount of Botulinum toxin used: ° °Dilution °200 units of Botox was reconstituted with 4 ml of preservative free normal saline. °Time of reconstitution: At the time of the office visit (<30 minutes prior to injection)  ° °Injections  °155 total units of Botox was injected with a 30 gauge needle. ° °Injection Sites: °L occipitalis: 15 units- 3 sites  °R occiptalis: 15 units- 3 sites ° °L upper trapezius: 15 units- 3 sites °R upper trapezius: 15 units- 3 sits          °L paraspinal: 10 units- 2 sites °R paraspinal: 10 units- 2 sites ° °Face °L frontalis(2 injection sites):10 units   °R frontalis(2 injection sites):10 units         °L corrugator: 5 units   °R corrugator: 5 units           °Procerus: 5 units   °L temporalis: 20 units °R temporalis: 20 units  ° °Agent:  °200 units of botulinum Type  A (Onobotulinum Toxin type A) was reconstituted with 4 ml of preservative free normal saline.  °Time of reconstitution: At the time of the office visit (<30 minutes prior to injection)  ° ° ° Total injected (Units):  155 ° Total wasted (Units):  45 ° °Patient tolerated procedure well without complications.   °Reinjection is anticipated in 3 months. ° ° °

## 2021-04-12 DIAGNOSIS — M5136 Other intervertebral disc degeneration, lumbar region: Secondary | ICD-10-CM | POA: Diagnosis not present

## 2021-04-12 DIAGNOSIS — M1990 Unspecified osteoarthritis, unspecified site: Secondary | ICD-10-CM | POA: Diagnosis not present

## 2021-04-12 DIAGNOSIS — M5030 Other cervical disc degeneration, unspecified cervical region: Secondary | ICD-10-CM | POA: Diagnosis not present

## 2021-04-22 ENCOUNTER — Telehealth: Payer: Self-pay

## 2021-04-22 NOTE — Telephone Encounter (Signed)
F/u   Outcome Approved today  CaseId:[id number];Status:Approved;Review Type:Prior Auth;Coverage Start Date:03/23/2021;Coverage End Date:04/22/2022 ; Drug Botox 200UNIT solution  Form [facility] Electronic PA Form [phone] NCPDP)

## 2021-04-22 NOTE — Telephone Encounter (Signed)
F/U   [facility] Benefit Verification [id number] Submitted!  For BV Basic submissions, please allow 24 hours for results.  For BV Full submissions, please allow 24-48 hours for results.

## 2021-04-22 NOTE — Telephone Encounter (Signed)
New message   [facility] Parran KeyAnder Gaster - PA Case ID: [id number] Need help? Call us at [phone] Status Sent to [facility] Drug Botox 200UNIT solution Form [facility] Electronic PA Form [phone] NCPDP)

## 2021-04-26 DIAGNOSIS — F4323 Adjustment disorder with mixed anxiety and depressed mood: Secondary | ICD-10-CM | POA: Diagnosis not present

## 2021-05-03 DIAGNOSIS — F4323 Adjustment disorder with mixed anxiety and depressed mood: Secondary | ICD-10-CM | POA: Diagnosis not present

## 2021-05-13 ENCOUNTER — Telehealth: Payer: Self-pay

## 2021-05-13 NOTE — Telephone Encounter (Signed)
New message   Confirmation via [facility] website -Botox delivery on 05/18/21  BOTOX 200 UNIT SDV [id number] Rx: [id number] 84 Day Supply 1 Qty. 3 Refills  Rx Received: 02/28/2021  Ship To:Medical Facility  PA Expires:04/22/2022  Estimated Next Fill:05/18/2021.  Ship Date: 03/02/2021 Key ID: [id number]/A Rx Expires: 02/24/2022 Est. Delivery: 03/03/2021 Consent: On File

## 2021-05-17 DIAGNOSIS — F4323 Adjustment disorder with mixed anxiety and depressed mood: Secondary | ICD-10-CM | POA: Diagnosis not present

## 2021-06-09 DIAGNOSIS — F4323 Adjustment disorder with mixed anxiety and depressed mood: Secondary | ICD-10-CM | POA: Diagnosis not present

## 2021-06-10 ENCOUNTER — Ambulatory Visit (INDEPENDENT_AMBULATORY_CARE_PROVIDER_SITE_OTHER): Payer: BC Managed Care – PPO | Admitting: Neurology

## 2021-06-10 ENCOUNTER — Other Ambulatory Visit: Payer: Self-pay

## 2021-06-10 DIAGNOSIS — G43009 Migraine without aura, not intractable, without status migrainosus: Secondary | ICD-10-CM

## 2021-06-10 MED ORDER — ONABOTULINUMTOXINA 100 UNITS IJ SOLR
200.0000 [IU] | Freq: Once | INTRAMUSCULAR | Status: AC
  Administered 2021-06-10: 155 [IU] via INTRAMUSCULAR

## 2021-06-10 NOTE — Progress Notes (Signed)
[facility]   Procedure Note Botox  Attending: Dr. Cylan Borum  Preoperative Diagnosis(es): Chronic migraine  Consent obtained from: The patient Benefits discussed included, but were not limited to decreased muscle tightness, increased joint range of motion, and decreased pain.  Risk discussed included, but were not limited pain and discomfort, bleeding, bruising, excessive weakness, venous thrombosis, muscle atrophy and dysphagia.  Anticipated outcomes of the procedure as well as he risks and benefits of the alternatives to the procedure, and the roles and tasks of the personnel to be involved, were discussed with the patient, and the patient consents to the procedure and agrees to proceed. A copy of the patient medication guide was given to the patient which explains the blackbox warning.  Patients identity and treatment sites confirmed Yes.  .  Details of Procedure: Skin was cleaned with alcohol. Prior to injection, the needle plunger was aspirated to make sure the needle was not within a blood vessel.  There was no blood retrieved on aspiration.    Following is a summary of the muscles injected  And the amount of Botulinum toxin used:  Dilution 200 units of Botox was reconstituted with 4 ml of preservative free normal saline. Time of reconstitution: At the time of the office visit (<30 minutes prior to injection)   Injections  155 total units of Botox was injected with a 30 gauge needle.  Injection Sites: L occipitalis: 15 units- 3 sites  R occiptalis: 15 units- 3 sites  L upper trapezius: 15 units- 3 sites R upper trapezius: 15 units- 3 sits          L paraspinal: 10 units- 2 sites R paraspinal: 10 units- 2 sites  Face L frontalis(2 injection sites):10 units   R frontalis(2 injection sites):10 units         L corrugator: 5 units   R corrugator: 5 units           Procerus: 5 units   L temporalis: 20 units R temporalis: 20 units   Agent:  200 units of botulinum Type  A (Onobotulinum Toxin type A) was reconstituted with 4 ml of preservative free normal saline.  Time of reconstitution: At the time of the office visit (<30 minutes prior to injection)     Total injected (Units): 155  Total wasted (Units): none wasted  Patient tolerated procedure well without complications.   Reinjection is anticipated in 3 months.    

## 2021-06-13 DIAGNOSIS — M5030 Other cervical disc degeneration, unspecified cervical region: Secondary | ICD-10-CM | POA: Diagnosis not present

## 2021-06-13 DIAGNOSIS — M5136 Other intervertebral disc degeneration, lumbar region: Secondary | ICD-10-CM | POA: Diagnosis not present

## 2021-06-14 DIAGNOSIS — E039 Hypothyroidism, unspecified: Secondary | ICD-10-CM | POA: Diagnosis not present

## 2021-06-14 DIAGNOSIS — G4700 Insomnia, unspecified: Secondary | ICD-10-CM | POA: Diagnosis not present

## 2021-06-21 DIAGNOSIS — F4323 Adjustment disorder with mixed anxiety and depressed mood: Secondary | ICD-10-CM | POA: Diagnosis not present

## 2021-06-29 DIAGNOSIS — Z780 Asymptomatic menopausal state: Secondary | ICD-10-CM | POA: Diagnosis not present

## 2021-06-29 DIAGNOSIS — M8588 Other specified disorders of bone density and structure, other site: Secondary | ICD-10-CM | POA: Diagnosis not present

## 2021-06-29 DIAGNOSIS — F4323 Adjustment disorder with mixed anxiety and depressed mood: Secondary | ICD-10-CM | POA: Diagnosis not present

## 2021-07-06 DIAGNOSIS — F4323 Adjustment disorder with mixed anxiety and depressed mood: Secondary | ICD-10-CM | POA: Diagnosis not present

## 2021-07-13 DIAGNOSIS — F4323 Adjustment disorder with mixed anxiety and depressed mood: Secondary | ICD-10-CM | POA: Diagnosis not present

## 2021-07-20 DIAGNOSIS — F4323 Adjustment disorder with mixed anxiety and depressed mood: Secondary | ICD-10-CM | POA: Diagnosis not present

## 2021-08-02 DIAGNOSIS — Z1211 Encounter for screening for malignant neoplasm of colon: Secondary | ICD-10-CM | POA: Diagnosis not present

## 2021-08-02 DIAGNOSIS — K648 Other hemorrhoids: Secondary | ICD-10-CM | POA: Diagnosis not present

## 2021-08-15 DIAGNOSIS — M7741 Metatarsalgia, right foot: Secondary | ICD-10-CM | POA: Diagnosis not present

## 2021-08-15 DIAGNOSIS — M79672 Pain in left foot: Secondary | ICD-10-CM | POA: Diagnosis not present

## 2021-08-15 DIAGNOSIS — M779 Enthesopathy, unspecified: Secondary | ICD-10-CM | POA: Diagnosis not present

## 2021-08-15 DIAGNOSIS — M79671 Pain in right foot: Secondary | ICD-10-CM | POA: Diagnosis not present

## 2021-09-09 ENCOUNTER — Other Ambulatory Visit: Payer: Self-pay

## 2021-09-09 ENCOUNTER — Ambulatory Visit (INDEPENDENT_AMBULATORY_CARE_PROVIDER_SITE_OTHER): Payer: BC Managed Care – PPO | Admitting: Neurology

## 2021-09-09 DIAGNOSIS — G43709 Chronic migraine without aura, not intractable, without status migrainosus: Secondary | ICD-10-CM | POA: Diagnosis not present

## 2021-09-09 MED ORDER — ONABOTULINUMTOXINA 100 UNITS IJ SOLR
200.0000 [IU] | Freq: Once | INTRAMUSCULAR | Status: AC
  Administered 2021-09-09: 155 [IU] via INTRAMUSCULAR

## 2021-09-09 NOTE — Progress Notes (Signed)
[facility]  ° °Procedure Note Botox ° °Attending: Dr. Jashanti Clinkscale ° °Preoperative Diagnosis(es): Chronic migraine ° °Consent obtained from: The patient °Benefits discussed included, but were not limited to decreased muscle tightness, increased joint range of motion, and decreased pain.  Risk discussed included, but were not limited pain and discomfort, bleeding, bruising, excessive weakness, venous thrombosis, muscle atrophy and dysphagia.  Anticipated outcomes of the procedure as well as he risks and benefits of the alternatives to the procedure, and the roles and tasks of the personnel to be involved, were discussed with the patient, and the patient consents to the procedure and agrees to proceed. A copy of the patient medication guide was given to the patient which explains the blackbox warning. ° °Patients identity and treatment sites confirmed Yes.  . ° °Details of Procedure: °Skin was cleaned with alcohol. Prior to injection, the needle plunger was aspirated to make sure the needle was not within a blood vessel.  There was no blood retrieved on aspiration.   ° °Following is a summary of the muscles injected  And the amount of Botulinum toxin used: ° °Dilution °200 units of Botox was reconstituted with 4 ml of preservative free normal saline. °Time of reconstitution: At the time of the office visit (<30 minutes prior to injection)  ° °Injections  °155 total units of Botox was injected with a 30 gauge needle. ° °Injection Sites: °L occipitalis: 15 units- 3 sites  °R occiptalis: 15 units- 3 sites ° °L upper trapezius: 15 units- 3 sites °R upper trapezius: 15 units- 3 sits          °L paraspinal: 10 units- 2 sites °R paraspinal: 10 units- 2 sites ° °Face °L frontalis(2 injection sites):10 units   °R frontalis(2 injection sites):10 units         °L corrugator: 5 units   °R corrugator: 5 units           °Procerus: 5 units   °L temporalis: 20 units °R temporalis: 20 units  ° °Agent:  °200 units of botulinum Type  A (Onobotulinum Toxin type A) was reconstituted with 4 ml of preservative free normal saline.  °Time of reconstitution: At the time of the office visit (<30 minutes prior to injection)  ° ° ° Total injected (Units):  155 ° Total wasted (Units):  45 ° °Patient tolerated procedure well without complications.   °Reinjection is anticipated in 3 months. ° ° °

## 2021-10-17 DIAGNOSIS — F4323 Adjustment disorder with mixed anxiety and depressed mood: Secondary | ICD-10-CM | POA: Diagnosis not present

## 2021-10-20 DIAGNOSIS — M25561 Pain in right knee: Secondary | ICD-10-CM | POA: Diagnosis not present

## 2021-10-21 ENCOUNTER — Other Ambulatory Visit: Payer: Self-pay

## 2021-10-21 DIAGNOSIS — G43709 Chronic migraine without aura, not intractable, without status migrainosus: Secondary | ICD-10-CM

## 2021-10-21 MED ORDER — BOTOX 200 UNITS IJ SOLR: INTRAMUSCULAR | 4 refills | Status: DC

## 2021-10-21 NOTE — Telephone Encounter (Signed)
Fax recevied from [facility] please refill Botox refill sent ?

## 2021-10-24 DIAGNOSIS — F4323 Adjustment disorder with mixed anxiety and depressed mood: Secondary | ICD-10-CM | POA: Diagnosis not present

## 2021-11-01 DIAGNOSIS — F4323 Adjustment disorder with mixed anxiety and depressed mood: Secondary | ICD-10-CM | POA: Diagnosis not present

## 2021-11-07 DIAGNOSIS — F4323 Adjustment disorder with mixed anxiety and depressed mood: Secondary | ICD-10-CM | POA: Diagnosis not present

## 2021-11-22 ENCOUNTER — Other Ambulatory Visit (HOSPITAL_COMMUNITY): Payer: Self-pay

## 2021-11-30 ENCOUNTER — Telehealth: Payer: Self-pay

## 2021-11-30 NOTE — Telephone Encounter (Signed)
Tried calling patient, No answer. Unable to lvm.  ?Please call [facility] to give consent to delivery of Botox before her appt on 12/09/21.  ?

## 2021-12-09 ENCOUNTER — Ambulatory Visit (INDEPENDENT_AMBULATORY_CARE_PROVIDER_SITE_OTHER): Payer: BC Managed Care – PPO | Admitting: Neurology

## 2021-12-09 DIAGNOSIS — G43709 Chronic migraine without aura, not intractable, without status migrainosus: Secondary | ICD-10-CM | POA: Diagnosis not present

## 2021-12-09 MED ORDER — ONABOTULINUMTOXINA 100 UNITS IJ SOLR
200.0000 [IU] | Freq: Once | INTRAMUSCULAR | Status: AC
  Administered 2021-12-09: 155 [IU] via INTRAMUSCULAR

## 2021-12-09 NOTE — Progress Notes (Signed)
[facility]  ° °Procedure Note Botox ° °Attending: Dr. Luma Clopper ° °Preoperative Diagnosis(es): Chronic migraine ° °Consent obtained from: The patient °Benefits discussed included, but were not limited to decreased muscle tightness, increased joint range of motion, and decreased pain.  Risk discussed included, but were not limited pain and discomfort, bleeding, bruising, excessive weakness, venous thrombosis, muscle atrophy and dysphagia.  Anticipated outcomes of the procedure as well as he risks and benefits of the alternatives to the procedure, and the roles and tasks of the personnel to be involved, were discussed with the patient, and the patient consents to the procedure and agrees to proceed. A copy of the patient medication guide was given to the patient which explains the blackbox warning. ° °Patients identity and treatment sites confirmed Yes.  . ° °Details of Procedure: °Skin was cleaned with alcohol. Prior to injection, the needle plunger was aspirated to make sure the needle was not within a blood vessel.  There was no blood retrieved on aspiration.   ° °Following is a summary of the muscles injected  And the amount of Botulinum toxin used: ° °Dilution °200 units of Botox was reconstituted with 4 ml of preservative free normal saline. °Time of reconstitution: At the time of the office visit (<30 minutes prior to injection)  ° °Injections  °155 total units of Botox was injected with a 30 gauge needle. ° °Injection Sites: °L occipitalis: 15 units- 3 sites  °R occiptalis: 15 units- 3 sites ° °L upper trapezius: 15 units- 3 sites °R upper trapezius: 15 units- 3 sits          °L paraspinal: 10 units- 2 sites °R paraspinal: 10 units- 2 sites ° °Face °L frontalis(2 injection sites):10 units   °R frontalis(2 injection sites):10 units         °L corrugator: 5 units   °R corrugator: 5 units           °Procerus: 5 units   °L temporalis: 20 units °R temporalis: 20 units  ° °Agent:  °200 units of botulinum Type  A (Onobotulinum Toxin type A) was reconstituted with 4 ml of preservative free normal saline.  °Time of reconstitution: At the time of the office visit (<30 minutes prior to injection)  ° ° ° Total injected (Units):  155 ° Total wasted (Units):  45 ° °Patient tolerated procedure well without complications.   °Reinjection is anticipated in 3 months. ° ° °
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
## 2022-01-18 ENCOUNTER — Encounter: Payer: Self-pay | Admitting: Neurology

## 2022-01-18 ENCOUNTER — Ambulatory Visit (INDEPENDENT_AMBULATORY_CARE_PROVIDER_SITE_OTHER): Payer: BC Managed Care – PPO | Admitting: Neurology

## 2022-01-18 VITALS — BP 146/74 | HR 67 | Ht 63.0 in | Wt 106.2 lb

## 2022-01-18 DIAGNOSIS — G44219 Episodic tension-type headache, not intractable: Secondary | ICD-10-CM | POA: Diagnosis not present

## 2022-01-18 DIAGNOSIS — G43009 Migraine without aura, not intractable, without status migrainosus: Secondary | ICD-10-CM | POA: Diagnosis not present

## 2022-01-18 MED ORDER — BACLOFEN 10 MG PO TABS: 10.0000 mg | ORAL_TABLET | Freq: Three times a day (TID) | ORAL | 0 refills | Status: DC | PRN

## 2022-01-25 DIAGNOSIS — M26603 Bilateral temporomandibular joint disorder, unspecified: Secondary | ICD-10-CM | POA: Diagnosis not present

## 2022-01-25 DIAGNOSIS — R519 Headache, unspecified: Secondary | ICD-10-CM | POA: Diagnosis not present

## 2022-01-25 DIAGNOSIS — M542 Cervicalgia: Secondary | ICD-10-CM | POA: Diagnosis not present

## 2022-02-02 DIAGNOSIS — R519 Headache, unspecified: Secondary | ICD-10-CM | POA: Diagnosis not present

## 2022-02-02 DIAGNOSIS — M26603 Bilateral temporomandibular joint disorder, unspecified: Secondary | ICD-10-CM | POA: Diagnosis not present

## 2022-02-02 DIAGNOSIS — M542 Cervicalgia: Secondary | ICD-10-CM | POA: Diagnosis not present

## 2022-02-08 DIAGNOSIS — M26603 Bilateral temporomandibular joint disorder, unspecified: Secondary | ICD-10-CM | POA: Diagnosis not present

## 2022-02-08 DIAGNOSIS — M542 Cervicalgia: Secondary | ICD-10-CM | POA: Diagnosis not present

## 2022-02-08 DIAGNOSIS — R519 Headache, unspecified: Secondary | ICD-10-CM | POA: Diagnosis not present

## 2022-02-13 DIAGNOSIS — M542 Cervicalgia: Secondary | ICD-10-CM | POA: Diagnosis not present

## 2022-02-13 DIAGNOSIS — M26603 Bilateral temporomandibular joint disorder, unspecified: Secondary | ICD-10-CM | POA: Diagnosis not present

## 2022-02-13 DIAGNOSIS — R519 Headache, unspecified: Secondary | ICD-10-CM | POA: Diagnosis not present

## 2022-02-14 ENCOUNTER — Other Ambulatory Visit: Payer: Self-pay | Admitting: Neurology

## 2022-02-15 ENCOUNTER — Telehealth: Payer: Self-pay | Admitting: Neurology

## 2022-02-15 DIAGNOSIS — M542 Cervicalgia: Secondary | ICD-10-CM | POA: Diagnosis not present

## 2022-02-15 DIAGNOSIS — R519 Headache, unspecified: Secondary | ICD-10-CM | POA: Diagnosis not present

## 2022-02-15 DIAGNOSIS — M26603 Bilateral temporomandibular joint disorder, unspecified: Secondary | ICD-10-CM | POA: Diagnosis not present

## 2022-02-15 NOTE — Telephone Encounter (Signed)
Patient left message with access nurse stating that she was given a muscle relaxer and it has not helped.  She wanted to let the Dr know she is going to stop taking the medication.

## 2022-02-20 DIAGNOSIS — R519 Headache, unspecified: Secondary | ICD-10-CM | POA: Diagnosis not present

## 2022-02-20 DIAGNOSIS — M542 Cervicalgia: Secondary | ICD-10-CM | POA: Diagnosis not present

## 2022-02-20 DIAGNOSIS — M26603 Bilateral temporomandibular joint disorder, unspecified: Secondary | ICD-10-CM | POA: Diagnosis not present

## 2022-02-22 DIAGNOSIS — M542 Cervicalgia: Secondary | ICD-10-CM | POA: Diagnosis not present

## 2022-02-22 DIAGNOSIS — M26603 Bilateral temporomandibular joint disorder, unspecified: Secondary | ICD-10-CM | POA: Diagnosis not present

## 2022-02-22 DIAGNOSIS — R519 Headache, unspecified: Secondary | ICD-10-CM | POA: Diagnosis not present

## 2022-02-27 DIAGNOSIS — M26603 Bilateral temporomandibular joint disorder, unspecified: Secondary | ICD-10-CM | POA: Diagnosis not present

## 2022-02-27 DIAGNOSIS — R519 Headache, unspecified: Secondary | ICD-10-CM | POA: Diagnosis not present

## 2022-02-27 DIAGNOSIS — M542 Cervicalgia: Secondary | ICD-10-CM | POA: Diagnosis not present

## 2022-03-02 DIAGNOSIS — M542 Cervicalgia: Secondary | ICD-10-CM | POA: Diagnosis not present

## 2022-03-02 DIAGNOSIS — R519 Headache, unspecified: Secondary | ICD-10-CM | POA: Diagnosis not present

## 2022-03-02 DIAGNOSIS — M26603 Bilateral temporomandibular joint disorder, unspecified: Secondary | ICD-10-CM | POA: Diagnosis not present

## 2022-03-06 DIAGNOSIS — M542 Cervicalgia: Secondary | ICD-10-CM | POA: Diagnosis not present

## 2022-03-06 DIAGNOSIS — M26603 Bilateral temporomandibular joint disorder, unspecified: Secondary | ICD-10-CM | POA: Diagnosis not present

## 2022-03-06 DIAGNOSIS — R519 Headache, unspecified: Secondary | ICD-10-CM | POA: Diagnosis not present

## 2022-03-08 DIAGNOSIS — M542 Cervicalgia: Secondary | ICD-10-CM | POA: Diagnosis not present

## 2022-03-08 DIAGNOSIS — M26603 Bilateral temporomandibular joint disorder, unspecified: Secondary | ICD-10-CM | POA: Diagnosis not present

## 2022-03-08 DIAGNOSIS — R519 Headache, unspecified: Secondary | ICD-10-CM | POA: Diagnosis not present

## 2022-03-10 ENCOUNTER — Ambulatory Visit: Payer: BC Managed Care – PPO | Admitting: Neurology

## 2022-03-10 ENCOUNTER — Ambulatory Visit (INDEPENDENT_AMBULATORY_CARE_PROVIDER_SITE_OTHER): Payer: BC Managed Care – PPO | Admitting: Neurology

## 2022-03-10 DIAGNOSIS — G43009 Migraine without aura, not intractable, without status migrainosus: Secondary | ICD-10-CM

## 2022-03-10 DIAGNOSIS — G43709 Chronic migraine without aura, not intractable, without status migrainosus: Secondary | ICD-10-CM | POA: Diagnosis not present

## 2022-03-10 MED ORDER — ONABOTULINUMTOXINA 200 UNITS IJ SOLR
200.0000 [IU] | Freq: Once | INTRAMUSCULAR | Status: AC
  Administered 2022-03-10: 155 [IU] via INTRAMUSCULAR

## 2022-03-10 NOTE — Progress Notes (Signed)
[facility]  ° °Procedure Note Botox ° °Attending: Dr. Nashton Belson ° °Preoperative Diagnosis(es): Chronic migraine ° °Consent obtained from: The patient °Benefits discussed included, but were not limited to decreased muscle tightness, increased joint range of motion, and decreased pain.  Risk discussed included, but were not limited pain and discomfort, bleeding, bruising, excessive weakness, venous thrombosis, muscle atrophy and dysphagia.  Anticipated outcomes of the procedure as well as he risks and benefits of the alternatives to the procedure, and the roles and tasks of the personnel to be involved, were discussed with the patient, and the patient consents to the procedure and agrees to proceed. A copy of the patient medication guide was given to the patient which explains the blackbox warning. ° °Patients identity and treatment sites confirmed Yes.  . ° °Details of Procedure: °Skin was cleaned with alcohol. Prior to injection, the needle plunger was aspirated to make sure the needle was not within a blood vessel.  There was no blood retrieved on aspiration.   ° °Following is a summary of the muscles injected  And the amount of Botulinum toxin used: ° °Dilution °200 units of Botox was reconstituted with 4 ml of preservative free normal saline. °Time of reconstitution: At the time of the office visit (<30 minutes prior to injection)  ° °Injections  °155 total units of Botox was injected with a 30 gauge needle. ° °Injection Sites: °L occipitalis: 15 units- 3 sites  °R occiptalis: 15 units- 3 sites ° °L upper trapezius: 15 units- 3 sites °R upper trapezius: 15 units- 3 sits          °L paraspinal: 10 units- 2 sites °R paraspinal: 10 units- 2 sites ° °Face °L frontalis(2 injection sites):10 units   °R frontalis(2 injection sites):10 units         °L corrugator: 5 units   °R corrugator: 5 units           °Procerus: 5 units   °L temporalis: 20 units °R temporalis: 20 units  ° °Agent:  °200 units of botulinum Type  A (Onobotulinum Toxin type A) was reconstituted with 4 ml of preservative free normal saline.  °Time of reconstitution: At the time of the office visit (<30 minutes prior to injection)  ° ° ° Total injected (Units):  155 ° Total wasted (Units):  45 ° °Patient tolerated procedure well without complications.   °Reinjection is anticipated in 3 months. ° ° °

## 2022-03-13 DIAGNOSIS — M542 Cervicalgia: Secondary | ICD-10-CM | POA: Diagnosis not present

## 2022-03-13 DIAGNOSIS — R519 Headache, unspecified: Secondary | ICD-10-CM | POA: Diagnosis not present

## 2022-03-13 DIAGNOSIS — M26603 Bilateral temporomandibular joint disorder, unspecified: Secondary | ICD-10-CM | POA: Diagnosis not present

## 2022-03-22 DIAGNOSIS — R519 Headache, unspecified: Secondary | ICD-10-CM | POA: Diagnosis not present

## 2022-03-22 DIAGNOSIS — M26603 Bilateral temporomandibular joint disorder, unspecified: Secondary | ICD-10-CM | POA: Diagnosis not present

## 2022-03-22 DIAGNOSIS — M542 Cervicalgia: Secondary | ICD-10-CM | POA: Diagnosis not present

## 2022-04-04 DIAGNOSIS — M26603 Bilateral temporomandibular joint disorder, unspecified: Secondary | ICD-10-CM | POA: Diagnosis not present

## 2022-04-04 DIAGNOSIS — R519 Headache, unspecified: Secondary | ICD-10-CM | POA: Diagnosis not present

## 2022-04-04 DIAGNOSIS — M542 Cervicalgia: Secondary | ICD-10-CM | POA: Diagnosis not present

## 2022-04-06 DIAGNOSIS — M542 Cervicalgia: Secondary | ICD-10-CM | POA: Diagnosis not present

## 2022-04-06 DIAGNOSIS — M26603 Bilateral temporomandibular joint disorder, unspecified: Secondary | ICD-10-CM | POA: Diagnosis not present

## 2022-04-06 DIAGNOSIS — R519 Headache, unspecified: Secondary | ICD-10-CM | POA: Diagnosis not present

## 2022-04-11 DIAGNOSIS — M542 Cervicalgia: Secondary | ICD-10-CM | POA: Diagnosis not present

## 2022-04-11 DIAGNOSIS — R519 Headache, unspecified: Secondary | ICD-10-CM | POA: Diagnosis not present

## 2022-04-11 DIAGNOSIS — M26603 Bilateral temporomandibular joint disorder, unspecified: Secondary | ICD-10-CM | POA: Diagnosis not present

## 2022-04-13 DIAGNOSIS — F4323 Adjustment disorder with mixed anxiety and depressed mood: Secondary | ICD-10-CM | POA: Diagnosis not present

## 2022-05-02 DIAGNOSIS — F4323 Adjustment disorder with mixed anxiety and depressed mood: Secondary | ICD-10-CM | POA: Diagnosis not present

## 2022-05-08 ENCOUNTER — Telehealth: Payer: Self-pay

## 2022-05-08 NOTE — Telephone Encounter (Signed)
Botox appt 06/09/22 PA expired 03/2022

## 2022-05-10 NOTE — Telephone Encounter (Signed)
Submitted a Prior Authorization request to [employer] for  BOTOX 200  via CoverMyMeds. Will update once we receive a response.   Key: [id number]

## 2022-05-18 ENCOUNTER — Other Ambulatory Visit (HOSPITAL_COMMUNITY): Payer: Self-pay

## 2022-05-18 NOTE — Telephone Encounter (Signed)
Received notification from [employer] regarding a prior authorization for  BOTOX . Authorization has been APPROVED from 04/10/2022 to 05/15/2023.    Authorization # [id number]  Test claim reveals that pt's copay is $50.00. Per [facility] portal, pt has never been enrolled into savings card program. Enrolled pt and provided info to the pharmacy. Copy of copay card sent to scan center for retention.   While providing copay card info to [facility], shipment of Botox has been scheduled for delivery on 05/23/22 with a note to ensure delivery is completed prior to 3:30pm.

## 2022-05-23 DIAGNOSIS — F4323 Adjustment disorder with mixed anxiety and depressed mood: Secondary | ICD-10-CM | POA: Diagnosis not present

## 2022-05-25 DIAGNOSIS — M797 Fibromyalgia: Secondary | ICD-10-CM | POA: Diagnosis not present

## 2022-05-25 DIAGNOSIS — M8588 Other specified disorders of bone density and structure, other site: Secondary | ICD-10-CM | POA: Diagnosis not present

## 2022-05-25 DIAGNOSIS — G4700 Insomnia, unspecified: Secondary | ICD-10-CM | POA: Diagnosis not present

## 2022-05-25 DIAGNOSIS — Z1239 Encounter for other screening for malignant neoplasm of breast: Secondary | ICD-10-CM | POA: Diagnosis not present

## 2022-06-06 DIAGNOSIS — F4323 Adjustment disorder with mixed anxiety and depressed mood: Secondary | ICD-10-CM | POA: Diagnosis not present

## 2022-06-09 ENCOUNTER — Ambulatory Visit: Payer: BC Managed Care – PPO | Admitting: Neurology

## 2022-06-27 DIAGNOSIS — F4323 Adjustment disorder with mixed anxiety and depressed mood: Secondary | ICD-10-CM | POA: Diagnosis not present

## 2022-07-04 DIAGNOSIS — F4323 Adjustment disorder with mixed anxiety and depressed mood: Secondary | ICD-10-CM | POA: Diagnosis not present

## 2022-07-07 ENCOUNTER — Ambulatory Visit (INDEPENDENT_AMBULATORY_CARE_PROVIDER_SITE_OTHER): Payer: BC Managed Care – PPO | Admitting: Neurology

## 2022-07-07 DIAGNOSIS — G43709 Chronic migraine without aura, not intractable, without status migrainosus: Secondary | ICD-10-CM | POA: Diagnosis not present

## 2022-07-07 DIAGNOSIS — M542 Cervicalgia: Secondary | ICD-10-CM

## 2022-07-07 MED ORDER — ONABOTULINUMTOXINA 100 UNITS IJ SOLR
200.0000 [IU] | Freq: Once | INTRAMUSCULAR | Status: AC
  Administered 2022-07-07: 155 [IU] via INTRAMUSCULAR

## 2022-07-07 NOTE — Progress Notes (Signed)
Patient is experiencing myofascial cervical pain and significant myofascial pain involving the left trapezius.  She has tried physical therapy, trigger point injections, rolfing, dry needling, chiropractic medicine and muscle relaxants.  She would like a referral to a specialist.  I will refer her to Dr. Antoine Primas of Sports Medicine to see if he has any suggestions.

## 2022-07-07 NOTE — Addendum Note (Signed)
Addended by: Leida Lauth on: 07/07/2022 04:21 PM   Modules accepted: Orders

## 2022-07-07 NOTE — Progress Notes (Signed)
[facility]  ° °Procedure Note Botox ° °Attending: Dr. Demario Faniel ° °Preoperative Diagnosis(es): Chronic migraine ° °Consent obtained from: The patient °Benefits discussed included, but were not limited to decreased muscle tightness, increased joint range of motion, and decreased pain.  Risk discussed included, but were not limited pain and discomfort, bleeding, bruising, excessive weakness, venous thrombosis, muscle atrophy and dysphagia.  Anticipated outcomes of the procedure as well as he risks and benefits of the alternatives to the procedure, and the roles and tasks of the personnel to be involved, were discussed with the patient, and the patient consents to the procedure and agrees to proceed. A copy of the patient medication guide was given to the patient which explains the blackbox warning. ° °Patients identity and treatment sites confirmed Yes.  . ° °Details of Procedure: °Skin was cleaned with alcohol. Prior to injection, the needle plunger was aspirated to make sure the needle was not within a blood vessel.  There was no blood retrieved on aspiration.   ° °Following is a summary of the muscles injected  And the amount of Botulinum toxin used: ° °Dilution °200 units of Botox was reconstituted with 4 ml of preservative free normal saline. °Time of reconstitution: At the time of the office visit (<30 minutes prior to injection)  ° °Injections  °155 total units of Botox was injected with a 30 gauge needle. ° °Injection Sites: °L occipitalis: 15 units- 3 sites  °R occiptalis: 15 units- 3 sites ° °L upper trapezius: 15 units- 3 sites °R upper trapezius: 15 units- 3 sits          °L paraspinal: 10 units- 2 sites °R paraspinal: 10 units- 2 sites ° °Face °L frontalis(2 injection sites):10 units   °R frontalis(2 injection sites):10 units         °L corrugator: 5 units   °R corrugator: 5 units           °Procerus: 5 units   °L temporalis: 20 units °R temporalis: 20 units  ° °Agent:  °200 units of botulinum Type  A (Onobotulinum Toxin type A) was reconstituted with 4 ml of preservative free normal saline.  °Time of reconstitution: At the time of the office visit (<30 minutes prior to injection)  ° ° ° Total injected (Units):  155 ° Total wasted (Units):  45 ° °Patient tolerated procedure well without complications.   °Reinjection is anticipated in 3 months. ° ° °

## 2022-07-27 ENCOUNTER — Ambulatory Visit: Payer: BC Managed Care – PPO | Admitting: Sports Medicine

## 2022-07-28 NOTE — Progress Notes (Unsigned)
    Benito Mccreedy D.[place] Phone: [phone]   Assessment and Plan:     There are no diagnoses linked to this encounter.  ***   Pertinent previous records reviewed include ***   Follow Up: ***     Subjective:   I, Adryen Cookson, am serving as a Education administrator for Doctor Glennon Mac  Chief Complaint: neck pain   HPI:   07/31/2022 Patient is a 62 year old female complaining of neck pain. Patient states  Relevant Historical Information: ***  Additional pertinent review of systems negative.   Current Outpatient Medications:    acetaminophen-codeine (TYLENOL #3) 300-30 MG tablet, Take 1 tablet by mouth 2 (two) times daily as needed. (Patient not taking: Reported on 01/18/2022), Disp: , Rfl:    baclofen (LIORESAL) 10 MG tablet, TAKE 1 TABLET(10 MG) BY MOUTH THREE TIMES DAILY AS NEEDED FOR MUSCLE SPASMS, Disp: 90 tablet, Rfl: 0   Botulinum Toxin Type A (BOTOX) 200 units SOLR, INJECT 155 UNITS INTO THE MUSCLE OF THE HEAD, NECK, AND FACE EVERY 3 MONTHS FOR CHRONIC MIGRAINES. DISCARD UNUSED PORTION, Disp: 1 each, Rfl: 4   Cholecalciferol (VITAMIN D3) 2000 UNITS capsule, Take 2,000 Units by mouth daily., Disp: , Rfl:    co-enzyme Q-10 30 MG capsule, Take 30 mg by mouth 3 (three) times daily., Disp: , Rfl:    cyclobenzaprine (FLEXERIL) 10 MG tablet, Take 10 mg by mouth daily. (Patient not taking: Reported on 11/09/2020), Disp: , Rfl:    eszopiclone (LUNESTA) 2 MG TABS, Take 2 mg by mouth at bedtime. Take immediately before bedtime  (Patient not taking: Reported on 11/09/2020), Disp: , Rfl:    gabapentin (NEURONTIN) 600 MG tablet, Take 600 mg by mouth 3 (three) times daily. (Patient not taking: Reported on 01/18/2022), Disp: , Rfl:    IRON PO, Take by mouth. (Patient not taking: Reported on 01/18/2022), Disp: , Rfl:    levothyroxine (SYNTHROID, LEVOTHROID) 50 MCG tablet, Take 50 mcg by mouth daily., Disp: , Rfl:     Magnesium Oxide 250 MG TABS, Take 250 mg by mouth daily., Disp: , Rfl:    Multiple Vitamin (MULTIVITAMIN) tablet, Take 1 tablet by mouth daily., Disp: , Rfl:    polyethylene glycol (MIRALAX / GLYCOLAX) packet, Take 17 g by mouth daily. (Patient not taking: Reported on 01/18/2022), Disp: , Rfl:    Objective:     There were no vitals filed for this visit.    There is no height or weight on file to calculate BMI.    Physical Exam:    ***   Electronically signed by:  Benito Mccreedy D.[facility] 8:46 AM 07/28/22

## 2022-07-31 ENCOUNTER — Ambulatory Visit (INDEPENDENT_AMBULATORY_CARE_PROVIDER_SITE_OTHER): Payer: BC Managed Care – PPO | Admitting: Sports Medicine

## 2022-07-31 ENCOUNTER — Ambulatory Visit (INDEPENDENT_AMBULATORY_CARE_PROVIDER_SITE_OTHER): Payer: BC Managed Care – PPO

## 2022-07-31 VITALS — BP 120/78 | HR 69 | Ht 63.0 in | Wt 106.0 lb

## 2022-07-31 DIAGNOSIS — S46811A Strain of other muscles, fascia and tendons at shoulder and upper arm level, right arm, initial encounter: Secondary | ICD-10-CM

## 2022-07-31 DIAGNOSIS — M2550 Pain in unspecified joint: Secondary | ICD-10-CM | POA: Diagnosis not present

## 2022-07-31 DIAGNOSIS — M542 Cervicalgia: Secondary | ICD-10-CM

## 2022-07-31 DIAGNOSIS — M546 Pain in thoracic spine: Secondary | ICD-10-CM

## 2022-07-31 DIAGNOSIS — S46812A Strain of other muscles, fascia and tendons at shoulder and upper arm level, left arm, initial encounter: Secondary | ICD-10-CM

## 2022-07-31 DIAGNOSIS — Z981 Arthrodesis status: Secondary | ICD-10-CM | POA: Insufficient documentation

## 2022-07-31 DIAGNOSIS — G8929 Other chronic pain: Secondary | ICD-10-CM | POA: Insufficient documentation

## 2022-07-31 LAB — COMPREHENSIVE METABOLIC PANEL
ALT: 15 U/L (ref 0–35)
AST: 25 U/L (ref 0–37)
Albumin: 4.6 g/dL (ref 3.5–5.2)
Alkaline Phosphatase: 49 U/L (ref 39–117)
BUN: 11 mg/dL (ref 6–23)
CO2: 31 mEq/L (ref 19–32)
Calcium: 9.5 mg/dL (ref 8.4–10.5)
Chloride: 95 mEq/L — ABNORMAL LOW (ref 96–112)
Creatinine, Ser: 0.67 mg/dL (ref 0.40–1.20)
GFR: 92.68 mL/min (ref >60.00)
Glucose, Bld: 96 mg/dL (ref 70–99)
Potassium: 3.9 mEq/L (ref 3.5–5.1)
Sodium: 132 mEq/L — ABNORMAL LOW (ref 135–145)
Total Bilirubin: 0.6 mg/dL (ref 0.2–1.2)
Total Protein: 7.6 g/dL (ref 6.0–8.3)

## 2022-07-31 LAB — C-REACTIVE PROTEIN: CRP: <1 mg/dL (ref 0.5–20.0)

## 2022-07-31 LAB — CBC WITH DIFFERENTIAL/PLATELET
Basophils Absolute: 0.1 10*3/uL (ref 0.0–0.1)
Basophils Relative: 0.9 % (ref 0.0–3.0)
Eosinophils Absolute: 0.1 10*3/uL (ref 0.0–0.7)
Eosinophils Relative: 1.1 % (ref 0.0–5.0)
HCT: 38.5 % (ref 36.0–46.0)
Hemoglobin: 12.7 g/dL (ref 12.0–15.0)
Lymphocytes Relative: 25.9 % (ref 12.0–46.0)
Lymphs Abs: 1.6 10*3/uL (ref 0.7–4.0)
MCHC: 33 g/dL (ref 30.0–36.0)
MCV: 93.9 fl (ref 78.0–100.0)
Monocytes Absolute: 0.5 10*3/uL (ref 0.1–1.0)
Monocytes Relative: 8.3 % (ref 3.0–12.0)
Neutro Abs: 4 10*3/uL (ref 1.4–7.7)
Neutrophils Relative %: 63.8 % (ref 43.0–77.0)
Platelets: 235 10*3/uL (ref 150.0–400.0)
RBC: 4.1 Mil/uL (ref 3.87–5.11)
RDW: 13.2 % (ref 11.5–15.5)
WBC: 6.2 10*3/uL (ref 4.0–10.5)

## 2022-07-31 LAB — URIC ACID: Uric Acid, Serum: 3.4 mg/dL (ref 2.4–7.0)

## 2022-07-31 LAB — SEDIMENTATION RATE: Sed Rate: 24 mm/hr (ref 0–30)

## 2022-07-31 LAB — TSH: TSH: 3.65 u[IU]/mL (ref 0.35–5.50)

## 2022-07-31 NOTE — Patient Instructions (Addendum)
Good to see you  Labs on the way out  MRI referral  Follow up 3 days after to discuss MRI results if MRI is denied we will see you in 6 weeks

## 2022-08-01 DIAGNOSIS — F4323 Adjustment disorder with mixed anxiety and depressed mood: Secondary | ICD-10-CM | POA: Diagnosis not present

## 2022-08-01 LAB — FERRITIN: Ferritin: 52.1 ng/mL (ref 10.0–291.0)

## 2022-08-01 LAB — VITAMIN D 25 HYDROXY (VIT D DEFICIENCY, FRACTURES): VITD: 57.24 ng/mL (ref 30.00–100.00)

## 2022-08-02 LAB — RHEUMATOID FACTOR: Rheumatoid fact SerPl-aCnc: <14 IU/mL (ref <14)

## 2022-08-02 LAB — ANTI-NUCLEAR AB-TITER (ANA TITER)
ANA TITER: 1:80 {titer} — ABNORMAL HIGH
ANA Titer 1: 1:40 {titer} — ABNORMAL HIGH

## 2022-08-02 LAB — CYCLIC CITRUL PEPTIDE ANTIBODY, IGG: Cyclic Citrullin Peptide Ab: <16 UNITS

## 2022-08-02 LAB — ANA: Anti Nuclear Antibody (ANA): POSITIVE — AB

## 2022-08-03 ENCOUNTER — Other Ambulatory Visit: Payer: Self-pay | Admitting: Sports Medicine

## 2022-08-03 DIAGNOSIS — M2550 Pain in unspecified joint: Secondary | ICD-10-CM

## 2022-08-03 DIAGNOSIS — R768 Other specified abnormal immunological findings in serum: Secondary | ICD-10-CM

## 2022-08-03 DIAGNOSIS — G8929 Other chronic pain: Secondary | ICD-10-CM

## 2022-08-03 NOTE — Progress Notes (Unsigned)
Called and left VM to notify her about the referral to rheumatology , will discuss results at follow up visit per dr. Glennon Mac. no new changes to treatment plan at this time

## 2022-08-15 DIAGNOSIS — F4323 Adjustment disorder with mixed anxiety and depressed mood: Secondary | ICD-10-CM | POA: Diagnosis not present

## 2022-08-18 DIAGNOSIS — Z124 Encounter for screening for malignant neoplasm of cervix: Secondary | ICD-10-CM | POA: Diagnosis not present

## 2022-08-18 DIAGNOSIS — L309 Dermatitis, unspecified: Secondary | ICD-10-CM | POA: Diagnosis not present

## 2022-08-18 DIAGNOSIS — N950 Postmenopausal bleeding: Secondary | ICD-10-CM | POA: Diagnosis not present

## 2022-08-22 DIAGNOSIS — F4323 Adjustment disorder with mixed anxiety and depressed mood: Secondary | ICD-10-CM | POA: Diagnosis not present

## 2022-09-05 ENCOUNTER — Telehealth: Payer: Self-pay

## 2022-09-05 NOTE — Telephone Encounter (Signed)
Cannot find any of our locations we use in network with patient plan. patient was called and voicemail left to call back after getting in touch with insurance to find out what location is in network with insurance and then we could continue with this pre cert. patient has not contacted our offices back going to close this imaging order. will open order back up if patient contacts our office with this information.

## 2022-09-06 DIAGNOSIS — F4323 Adjustment disorder with mixed anxiety and depressed mood: Secondary | ICD-10-CM | POA: Diagnosis not present

## 2022-09-07 DIAGNOSIS — N952 Postmenopausal atrophic vaginitis: Secondary | ICD-10-CM | POA: Diagnosis not present

## 2022-09-07 DIAGNOSIS — N950 Postmenopausal bleeding: Secondary | ICD-10-CM | POA: Diagnosis not present

## 2022-09-07 DIAGNOSIS — N898 Other specified noninflammatory disorders of vagina: Secondary | ICD-10-CM | POA: Diagnosis not present

## 2022-09-08 ENCOUNTER — Ambulatory Visit: Payer: BC Managed Care – PPO | Admitting: Neurology

## 2022-09-26 DIAGNOSIS — F4323 Adjustment disorder with mixed anxiety and depressed mood: Secondary | ICD-10-CM | POA: Diagnosis not present

## 2022-09-28 DIAGNOSIS — N950 Postmenopausal bleeding: Secondary | ICD-10-CM | POA: Diagnosis not present

## 2022-10-06 ENCOUNTER — Ambulatory Visit (INDEPENDENT_AMBULATORY_CARE_PROVIDER_SITE_OTHER): Payer: BC Managed Care – PPO | Admitting: Neurology

## 2022-10-06 ENCOUNTER — Ambulatory Visit: Payer: BC Managed Care – PPO | Admitting: Neurology

## 2022-10-06 DIAGNOSIS — G43709 Chronic migraine without aura, not intractable, without status migrainosus: Secondary | ICD-10-CM | POA: Diagnosis not present

## 2022-10-06 MED ORDER — ONABOTULINUMTOXINA 100 UNITS IJ SOLR
200.0000 [IU] | Freq: Once | INTRAMUSCULAR | Status: AC
  Administered 2022-10-06: 155 [IU] via INTRAMUSCULAR

## 2022-10-06 NOTE — Progress Notes (Signed)
[facility]  ° °Procedure Note Botox ° °Attending: Dr. Karren Newland ° °Preoperative Diagnosis(es): Chronic migraine ° °Consent obtained from: The patient °Benefits discussed included, but were not limited to decreased muscle tightness, increased joint range of motion, and decreased pain.  Risk discussed included, but were not limited pain and discomfort, bleeding, bruising, excessive weakness, venous thrombosis, muscle atrophy and dysphagia.  Anticipated outcomes of the procedure as well as he risks and benefits of the alternatives to the procedure, and the roles and tasks of the personnel to be involved, were discussed with the patient, and the patient consents to the procedure and agrees to proceed. A copy of the patient medication guide was given to the patient which explains the blackbox warning. ° °Patients identity and treatment sites confirmed Yes.  . ° °Details of Procedure: °Skin was cleaned with alcohol. Prior to injection, the needle plunger was aspirated to make sure the needle was not within a blood vessel.  There was no blood retrieved on aspiration.   ° °Following is a summary of the muscles injected  And the amount of Botulinum toxin used: ° °Dilution °200 units of Botox was reconstituted with 4 ml of preservative free normal saline. °Time of reconstitution: At the time of the office visit (<30 minutes prior to injection)  ° °Injections  °155 total units of Botox was injected with a 30 gauge needle. ° °Injection Sites: °L occipitalis: 15 units- 3 sites  °R occiptalis: 15 units- 3 sites ° °L upper trapezius: 15 units- 3 sites °R upper trapezius: 15 units- 3 sits          °L paraspinal: 10 units- 2 sites °R paraspinal: 10 units- 2 sites ° °Face °L frontalis(2 injection sites):10 units   °R frontalis(2 injection sites):10 units         °L corrugator: 5 units   °R corrugator: 5 units           °Procerus: 5 units   °L temporalis: 20 units °R temporalis: 20 units  ° °Agent:  °200 units of botulinum Type  A (Onobotulinum Toxin type A) was reconstituted with 4 ml of preservative free normal saline.  °Time of reconstitution: At the time of the office visit (<30 minutes prior to injection)  ° ° ° Total injected (Units):  155 ° Total wasted (Units):  45 ° °Patient tolerated procedure well without complications.   °Reinjection is anticipated in 3 months. ° ° °

## 2022-10-10 DIAGNOSIS — F4323 Adjustment disorder with mixed anxiety and depressed mood: Secondary | ICD-10-CM | POA: Diagnosis not present

## 2022-11-03 DIAGNOSIS — R9389 Abnormal findings on diagnostic imaging of other specified body structures: Secondary | ICD-10-CM | POA: Diagnosis not present

## 2022-11-03 DIAGNOSIS — N9489 Other specified conditions associated with female genital organs and menstrual cycle: Secondary | ICD-10-CM | POA: Diagnosis not present

## 2022-11-03 DIAGNOSIS — N882 Stricture and stenosis of cervix uteri: Secondary | ICD-10-CM | POA: Diagnosis not present

## 2022-11-03 DIAGNOSIS — N950 Postmenopausal bleeding: Secondary | ICD-10-CM | POA: Diagnosis not present

## 2022-11-07 DIAGNOSIS — F4323 Adjustment disorder with mixed anxiety and depressed mood: Secondary | ICD-10-CM | POA: Diagnosis not present

## 2022-11-13 ENCOUNTER — Ambulatory Visit: Payer: BC Managed Care – PPO | Attending: Internal Medicine | Admitting: Internal Medicine

## 2022-11-13 ENCOUNTER — Encounter: Payer: Self-pay | Admitting: Internal Medicine

## 2022-11-13 VITALS — BP 106/67 | HR 67 | Resp 14 | Ht 63.0 in | Wt 108.0 lb

## 2022-11-13 DIAGNOSIS — G8929 Other chronic pain: Secondary | ICD-10-CM | POA: Diagnosis not present

## 2022-11-13 DIAGNOSIS — M797 Fibromyalgia: Secondary | ICD-10-CM | POA: Diagnosis not present

## 2022-11-13 DIAGNOSIS — R768 Other specified abnormal immunological findings in serum: Secondary | ICD-10-CM | POA: Diagnosis not present

## 2022-11-13 DIAGNOSIS — M546 Pain in thoracic spine: Secondary | ICD-10-CM | POA: Diagnosis not present
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
## 2022-11-13 NOTE — Patient Instructions (Signed)
I recommend checking out the [employer] patient-centered guide for fibromyalgia and chronic pain management: [email or web address].[email or web address].[email or web address].edu  

## 2022-11-14 LAB — C3 AND C4
C3 Complement: 105 mg/dL (ref 83–193)
C4 Complement: 26 mg/dL (ref 15–57)

## 2022-11-14 LAB — ANTI-DNA ANTIBODY, DOUBLE-STRANDED: ds DNA Ab: <1 IU/mL

## 2022-11-14 LAB — ANTI-SMITH ANTIBODY: ENA SM Ab Ser-aCnc: <1 AI

## 2022-11-14 LAB — SJOGRENS SYNDROME-A EXTRACTABLE NUCLEAR ANTIBODY: SSA (Ro) (ENA) Antibody, IgG: <1 AI

## 2022-11-14 LAB — SJOGRENS SYNDROME-B EXTRACTABLE NUCLEAR ANTIBODY: SSB (La) (ENA) Antibody, IgG: <1 AI

## 2022-11-14 LAB — CHROMATIN (NUCLEOSOMAL) ANTIBODY: Chromatin (Nucleosomal) Antibody: <1 AI

## 2022-11-14 LAB — RNP ANTIBODY: Ribonucleic Protein(ENA) Antibody, IgG: <1 AI

## 2022-11-23 ENCOUNTER — Ambulatory Visit (INDEPENDENT_AMBULATORY_CARE_PROVIDER_SITE_OTHER): Payer: BC Managed Care – PPO | Admitting: Sports Medicine

## 2022-11-23 VITALS — BP 108/78 | HR 82 | Ht 63.0 in | Wt 113.0 lb

## 2022-11-23 DIAGNOSIS — M546 Pain in thoracic spine: Secondary | ICD-10-CM | POA: Diagnosis not present

## 2022-11-23 DIAGNOSIS — M542 Cervicalgia: Secondary | ICD-10-CM | POA: Diagnosis not present

## 2022-11-23 DIAGNOSIS — Z981 Arthrodesis status: Secondary | ICD-10-CM

## 2022-11-23 DIAGNOSIS — R768 Other specified abnormal immunological findings in serum: Secondary | ICD-10-CM

## 2022-11-23 DIAGNOSIS — G8929 Other chronic pain: Secondary | ICD-10-CM

## 2022-11-23 DIAGNOSIS — M2550 Pain in unspecified joint: Secondary | ICD-10-CM | POA: Diagnosis not present
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
## 2022-11-23 NOTE — Patient Instructions (Addendum)
MRI referral  Follow up 3 days after MRI to discuss results
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
## 2022-11-29 DIAGNOSIS — N882 Stricture and stenosis of cervix uteri: Secondary | ICD-10-CM | POA: Diagnosis not present

## 2022-11-29 DIAGNOSIS — N950 Postmenopausal bleeding: Secondary | ICD-10-CM | POA: Diagnosis not present

## 2022-11-29 DIAGNOSIS — Z01818 Encounter for other preprocedural examination: Secondary | ICD-10-CM | POA: Diagnosis not present

## 2022-11-29 DIAGNOSIS — R9389 Abnormal findings on diagnostic imaging of other specified body structures: Secondary | ICD-10-CM | POA: Diagnosis not present

## 2022-11-30 DIAGNOSIS — Z0000 Encounter for general adult medical examination without abnormal findings: Secondary | ICD-10-CM | POA: Diagnosis not present

## 2022-12-01 ENCOUNTER — Encounter (HOSPITAL_BASED_OUTPATIENT_CLINIC_OR_DEPARTMENT_OTHER): Payer: Self-pay | Admitting: Obstetrics and Gynecology

## 2022-12-01 NOTE — Progress Notes (Signed)
Spoke w/ via phone for pre-op interview--- pt Lab needs dos----  cbc, t&s             Lab results------ no COVID test -----patient states asymptomatic no test needed Arrive at -------  1030 on 12-13-2022 NPO after MN NO Solid Food.  Clear liquids from MN until--- 0930 Med rec completed Medications to take morning of surgery ----- synthoid Diabetic medication -----  n/a Patient instructed no nail polish to be worn day of surgery Patient instructed to bring photo id and insurance card day of surgery Patient aware to have Driver (ride ) / caregiver    for 24 hours after surgery --- husband, jerry Patient Special Instructions -----  n/a Pre-Op special Instructions ----- n/a Patient verbalized understanding of instructions that were given at this phone interview. Patient denies shortness of breath, chest pain, fever, cough at this phone interview.

## 2022-12-12 NOTE — Anesthesia Preprocedure Evaluation (Signed)
Anesthesia Evaluation  Patient identified by MRN, date of birth, ID band Patient awake    Reviewed: Allergy & Precautions, H&P , NPO status , Patient's Chart, lab work & pertinent test results  Airway Mallampati: II  TM Distance: >3 FB Neck ROM: Full    Dental no notable dental hx. (+) Teeth Intact, Dental Advisory Given   Pulmonary neg pulmonary ROS   Pulmonary exam normal breath sounds clear to auscultation       Cardiovascular Exercise Tolerance: Good negative cardio ROS  Rhythm:Regular Rate:Normal     Neuro/Psych  Headaches  negative psych ROS   GI/Hepatic negative GI ROS, Neg liver ROS,,,  Endo/Other  Hypothyroidism    Renal/GU negative Renal ROS  negative genitourinary   Musculoskeletal  (+) Arthritis , Osteoarthritis,  Fibromyalgia -  Abdominal   Peds  Hematology  (+) Blood dyscrasia, anemia   Anesthesia Other Findings   Reproductive/Obstetrics negative OB ROS                             Anesthesia Physical Anesthesia Plan  ASA: 2  Anesthesia Plan: General   Post-op Pain Management: Tylenol PO (pre-op)*   Induction: Intravenous  PONV Risk Score and Plan: 4 or greater and Ondansetron, Dexamethasone, Propofol infusion and TIVA  Airway Management Planned: LMA  Additional Equipment:   Intra-op Plan:   Post-operative Plan: Extubation in OR  Informed Consent: I have reviewed the patients History and Physical, chart, labs and discussed the procedure including the risks, benefits and alternatives for the proposed anesthesia with the patient or authorized representative who has indicated his/her understanding and acceptance.     Dental advisory given  Plan Discussed with: CRNA and Surgeon  Anesthesia Plan Comments:        Anesthesia Quick Evaluation

## 2022-12-12 NOTE — Progress Notes (Signed)
Talked with patient about time change to be here between 0630 -0645. Clear liquids until 0545. Verbalized understanding

## 2022-12-13 ENCOUNTER — Ambulatory Visit (HOSPITAL_BASED_OUTPATIENT_CLINIC_OR_DEPARTMENT_OTHER): Payer: BC Managed Care – PPO | Admitting: Anesthesiology

## 2022-12-13 ENCOUNTER — Encounter (HOSPITAL_BASED_OUTPATIENT_CLINIC_OR_DEPARTMENT_OTHER)
Admission: RE | Disposition: A | Discharge status: Home / Self Care | Payer: Self-pay | Source: Home / Self Care | Attending: Obstetrics and Gynecology

## 2022-12-13 ENCOUNTER — Other Ambulatory Visit: Payer: Self-pay

## 2022-12-13 ENCOUNTER — Encounter (HOSPITAL_BASED_OUTPATIENT_CLINIC_OR_DEPARTMENT_OTHER): Payer: Self-pay | Admitting: Obstetrics and Gynecology

## 2022-12-13 ENCOUNTER — Ambulatory Visit (HOSPITAL_BASED_OUTPATIENT_CLINIC_OR_DEPARTMENT_OTHER)
Admission: RE | Admit: 2022-12-13 | Discharge: 2022-12-13 | Disposition: A | Discharge status: Home / Self Care | Payer: BC Managed Care – PPO | Attending: Obstetrics and Gynecology | Admitting: Obstetrics and Gynecology

## 2022-12-13 DIAGNOSIS — M1990 Unspecified osteoarthritis, unspecified site: Secondary | ICD-10-CM | POA: Diagnosis not present

## 2022-12-13 DIAGNOSIS — E039 Hypothyroidism, unspecified: Secondary | ICD-10-CM | POA: Insufficient documentation

## 2022-12-13 DIAGNOSIS — Z01818 Encounter for other preprocedural examination: Secondary | ICD-10-CM

## 2022-12-13 DIAGNOSIS — R519 Headache, unspecified: Secondary | ICD-10-CM | POA: Insufficient documentation

## 2022-12-13 DIAGNOSIS — R9389 Abnormal findings on diagnostic imaging of other specified body structures: Secondary | ICD-10-CM | POA: Insufficient documentation

## 2022-12-13 DIAGNOSIS — Z8742 Personal history of other diseases of the female genital tract: Secondary | ICD-10-CM | POA: Diagnosis not present

## 2022-12-13 DIAGNOSIS — D649 Anemia, unspecified: Secondary | ICD-10-CM | POA: Insufficient documentation

## 2022-12-13 DIAGNOSIS — M797 Fibromyalgia: Secondary | ICD-10-CM | POA: Diagnosis not present

## 2022-12-13 DIAGNOSIS — N950 Postmenopausal bleeding: Secondary | ICD-10-CM | POA: Insufficient documentation

## 2022-12-13 DIAGNOSIS — N857 Hematometra: Secondary | ICD-10-CM | POA: Insufficient documentation

## 2022-12-13 HISTORY — DX: Fibromyalgia: M79.7

## 2022-12-13 HISTORY — DX: Abnormal findings on diagnostic imaging of other specified body structures: R93.89

## 2022-12-13 HISTORY — DX: Postmenopausal bleeding: N95.0

## 2022-12-13 HISTORY — DX: Other constipation: K59.09

## 2022-12-13 HISTORY — DX: Other chronic pain: G89.29

## 2022-12-13 HISTORY — DX: Other cervical disc degeneration, unspecified cervical region: M50.30

## 2022-12-13 HISTORY — DX: Insomnia, unspecified: G47.00

## 2022-12-13 HISTORY — DX: Other specified abnormal immunological findings in serum: R76.8

## 2022-12-13 HISTORY — DX: Chronic migraine without aura, not intractable, without status migrainosus: G43.709

## 2022-12-13 HISTORY — DX: Personal history of other diseases of the female genital tract: Z87.42

## 2022-12-13 HISTORY — DX: Pain in unspecified joint: M25.50

## 2022-12-13 HISTORY — PX: DILATATION & CURETTAGE/HYSTEROSCOPY WITH MYOSURE: SHX6511

## 2022-12-13 HISTORY — DX: Other intervertebral disc degeneration, lumbar region: M51.36

## 2022-12-13 HISTORY — DX: Unspecified osteoarthritis, unspecified site: M19.90

## 2022-12-13 HISTORY — DX: Dermatitis, unspecified: L30.9

## 2022-12-13 LAB — TYPE AND SCREEN
ABO/RH(D): A POS
Antibody Screen: NEGATIVE

## 2022-12-13 LAB — CBC
HCT: 38.4 % (ref 36.0–46.0)
Hemoglobin: 12.4 g/dL (ref 12.0–15.0)
MCH: 32 pg (ref 26.0–34.0)
MCHC: 32.3 g/dL (ref 30.0–36.0)
MCV: 99.2 fL (ref 80.0–100.0)
Platelets: 214 10*3/uL (ref 150–400)
RBC: 3.87 MIL/uL (ref 3.87–5.11)
RDW: 13.2 % (ref 11.5–15.5)
WBC: 6.2 10*3/uL (ref 4.0–10.5)
nRBC: 0 % (ref 0.0–0.2)

## 2022-12-13 SURGERY — DILATATION & CURETTAGE/HYSTEROSCOPY WITH MYOSURE
Anesthesia: General | Site: Vagina

## 2022-12-13 MED ORDER — FENTANYL CITRATE (PF) 100 MCG/2ML IJ SOLN
INTRAMUSCULAR | Status: AC
  Filled 2022-12-13: qty 2

## 2022-12-13 MED ORDER — KETOROLAC TROMETHAMINE 30 MG/ML IJ SOLN
INTRAMUSCULAR | Status: DC | PRN
  Administered 2022-12-13: 30 mg via INTRAVENOUS

## 2022-12-13 MED ORDER — PROPOFOL 500 MG/50ML IV EMUL
INTRAVENOUS | Status: DC | PRN
  Administered 2022-12-13: 125 ug/kg/min via INTRAVENOUS

## 2022-12-13 MED ORDER — FENTANYL CITRATE (PF) 100 MCG/2ML IJ SOLN
INTRAMUSCULAR | Status: DC | PRN
  Administered 2022-12-13: 50 ug via INTRAVENOUS

## 2022-12-13 MED ORDER — ONDANSETRON HCL 4 MG/2ML IJ SOLN
INTRAMUSCULAR | Status: AC
  Filled 2022-12-13: qty 2

## 2022-12-13 MED ORDER — MIDAZOLAM HCL 2 MG/2ML IJ SOLN
INTRAMUSCULAR | Status: AC
  Filled 2022-12-13: qty 2

## 2022-12-13 MED ORDER — KETOROLAC TROMETHAMINE 30 MG/ML IJ SOLN
INTRAMUSCULAR | Status: AC
  Filled 2022-12-13: qty 1

## 2022-12-13 MED ORDER — PROPOFOL 10 MG/ML IV BOLUS
INTRAVENOUS | Status: AC
  Filled 2022-12-13: qty 20

## 2022-12-13 MED ORDER — SILVER NITRATE-POT NITRATE 75-25 % EX MISC
CUTANEOUS | Status: DC | PRN
  Administered 2022-12-13: 2

## 2022-12-13 MED ORDER — ONDANSETRON HCL 4 MG/2ML IJ SOLN
INTRAMUSCULAR | Status: DC | PRN
  Administered 2022-12-13: 4 mg via INTRAVENOUS

## 2022-12-13 MED ORDER — ACETAMINOPHEN 500 MG PO TABS
1000.0000 mg | ORAL_TABLET | Freq: Once | ORAL | Status: AC
  Administered 2022-12-13: 1000 mg via ORAL

## 2022-12-13 MED ORDER — DEXAMETHASONE SODIUM PHOSPHATE 4 MG/ML IJ SOLN
INTRAMUSCULAR | Status: DC | PRN
  Administered 2022-12-13: 5 mg via INTRAVENOUS

## 2022-12-13 MED ORDER — PROPOFOL 1000 MG/100ML IV EMUL
INTRAVENOUS | Status: AC
  Filled 2022-12-13: qty 100

## 2022-12-13 MED ORDER — LIDOCAINE HCL (CARDIAC) PF 100 MG/5ML IV SOSY
PREFILLED_SYRINGE | INTRAVENOUS | Status: DC | PRN
  Administered 2022-12-13: 50 mg via INTRAVENOUS

## 2022-12-13 MED ORDER — SODIUM CHLORIDE 0.9 % IR SOLN
Status: DC | PRN
  Administered 2022-12-13: 3000 mL

## 2022-12-13 MED ORDER — MIDAZOLAM HCL 5 MG/5ML IJ SOLN
INTRAMUSCULAR | Status: DC | PRN
  Administered 2022-12-13: 1 mg via INTRAVENOUS

## 2022-12-13 MED ORDER — DEXAMETHASONE SODIUM PHOSPHATE 10 MG/ML IJ SOLN
INTRAMUSCULAR | Status: AC
  Filled 2022-12-13: qty 1

## 2022-12-13 MED ORDER — ACETAMINOPHEN 500 MG PO TABS
ORAL_TABLET | ORAL | Status: AC
  Filled 2022-12-13: qty 2

## 2022-12-13 MED ORDER — PROPOFOL 10 MG/ML IV BOLUS
INTRAVENOUS | Status: DC | PRN
  Administered 2022-12-13: 130 mg via INTRAVENOUS

## 2022-12-13 MED ORDER — FENTANYL CITRATE (PF) 100 MCG/2ML IJ SOLN: 25.0000 ug | INTRAMUSCULAR | Status: DC | PRN

## 2022-12-13 MED ORDER — LACTATED RINGERS IV SOLN: INTRAVENOUS | Status: DC

## 2022-12-13 MED ORDER — IBUPROFEN 800 MG PO TABS
800.0000 mg | ORAL_TABLET | Freq: Three times a day (TID) | ORAL | 0 refills | Status: AC | PRN
Start: 2022-12-13 — End: ?

## 2022-12-13 SURGICAL SUPPLY — 17 items
DEVICE MYOSURE REACH (MISCELLANEOUS) IMPLANT
GAUZE 4X4 16PLY ~~LOC~~+RFID DBL (SPONGE) ×1 IMPLANT
GLOVE BIO SURGEON STRL SZ 6.5 (GLOVE) ×1 IMPLANT
GLOVE BIO SURGEON STRL SZ7 (GLOVE) IMPLANT
GLOVE BIOGEL M 6.5 STRL (GLOVE) ×1 IMPLANT
GLOVE BIOGEL PI IND STRL 7.0 (GLOVE) ×1 IMPLANT
GOWN STRL REUS W/TWL LRG LVL3 (GOWN DISPOSABLE) ×1 IMPLANT
GOWN STRL SURGICAL XL XLNG (GOWN DISPOSABLE) IMPLANT
KIT PROCEDURE FLUENT (KITS) ×1 IMPLANT
KIT TURNOVER CYSTO (KITS) ×1 IMPLANT
PACK VAGINAL MINOR WOMEN LF (CUSTOM PROCEDURE TRAY) ×1 IMPLANT
PAD OB MATERNITY 4.3X12.25 (PERSONAL CARE ITEMS) ×1 IMPLANT
SEAL ROD LENS SCOPE MYOSURE (ABLATOR) ×1 IMPLANT
SLEEVE SCD COMPRESS KNEE MED (STOCKING) ×1 IMPLANT
SOL PREP POV-IOD 4OZ 10% (MISCELLANEOUS) IMPLANT
TOWEL OR 17X24 6PK STRL BLUE (TOWEL DISPOSABLE) ×2 IMPLANT
UNDERPAD 30X36 HEAVY ABSORB (UNDERPADS AND DIAPERS) ×1 IMPLANT

## 2022-12-13 NOTE — Discharge Instructions (Signed)
No acetaminophen/Tylenol until after 1:10 pm today if needed.  No ibuprofen, Advil, Aleve, Motrin, ketorolac, meloxicam, naproxen, or other NSAIDS until after 5:40 pm today if needed.   Post Anesthesia Home Care Instructions  Activity: Get plenty of rest for the remainder of the day. A responsible individual must stay with you for 24 hours following the procedure.  For the next 24 hours, DO NOT: -Drive a car -Advertising copywriter -Drink alcoholic beverages -Take any medication unless instructed by your physician -Make any legal decisions or sign important papers.  Meals: Start with liquid foods such as gelatin or soup. Progress to regular foods as tolerated. Avoid greasy, spicy, heavy foods. If nausea and/or vomiting occur, drink only clear liquids until the nausea and/or vomiting subsides. Call your physician if vomiting continues.  Special Instructions/Symptoms: Your throat may feel dry or sore from the anesthesia or the breathing tube placed in your throat during surgery. If this causes discomfort, gargle with warm salt water. The discomfort should disappear within 24 hours.

## 2022-12-13 NOTE — Op Note (Signed)
Pre Op Dx:   1. Postmenopausal bleeding 2. Thickened endometrium on ultrasound (2.41cm) 3. History of endometrial polyps  Post Op Dx:   1. Postmenopausal bleeding 2. Thickened endometrium on ultrasound (2.41cm) 3. History of endometrial polyps 4. Hematometra  Procedure:   Hysteroscopy with Dilation and Curettage with Myosure   Surgeon:  Dr. Steva Ready Assistants:  None Anesthesia: LMA   EBL:  5cc  IVF:  400cc UOP:  Voided prior to arrival to OR Fluid Deficit:  20cc   Drains:  None Specimen removed:  Endometrial curettings - sent to pathology Device(s) implanted: None Case Type:  Clean-contaminated Findings:  Normal-appearing ectocervix and endocervical canal. Endometrium appeared thin and atrophic. Bilateral tubal ostia visualized. No masses or polyps noted on the endometrium. With cervical dilation, it was noted that ~5cc of dark brown blood was expelled from the uterus. Complications: None Indications:  62 y.o. postmenopausal female with postmenopausal bleeding and thickened endometrium on ultrasound (2.41cm).  Description of each procedure:  After informed consent was obtained the patient was taken to the operating room in the dorsal supine position.  After administration of general anesthesia, the patient was placed in the dorsal lithotomy position and prepped and draped in the usual sterile fashion. A pre-operative time-out was completed.  The anterior lip of the cervix was grasped with a single-tooth tenaculum and the cervix was serially dilated to accommodate the hysteroscope.  The hysteroscope was advanced and the findings as above was noted. The Myosure Reach was used to sample the endometrium in its entirety. A sharp banjo curette was used to curettage the endometrium. The single-tooth tenaculum was removed and its sites were made hemostatic with pressure and silver nitrate.  Adequate hemostasis was noted.  The patient was awakened and extubated and appeared to have  tolerated the procedure well.  All counts were correct.  Disposition:  PACU  Steva Ready, DO

## 2022-12-13 NOTE — Anesthesia Postprocedure Evaluation (Signed)
Anesthesia Post Note  Patient: Kaitlin Moses  Procedure(s) Performed: DILATATION & CURETTAGE/HYSTEROSCOPY WITH MYOSURE POLYPECTOMY (Vagina )     Patient location during evaluation: PACU Anesthesia Type: General Level of consciousness: awake and alert Pain management: pain level controlled Vital Signs Assessment: post-procedure vital signs reviewed and stable Respiratory status: spontaneous breathing, nonlabored ventilation and respiratory function stable Cardiovascular status: blood pressure returned to baseline and stable Postop Assessment: no apparent nausea or vomiting Anesthetic complications: no  No notable events documented.  Last Vitals:  Vitals:   12/13/22 1030 12/13/22 1102  BP: 126/79 122/74  Pulse: (!) 48 (!) 57  Resp: 12 18  Temp:    SpO2: 100% 99%    Last Pain:  Vitals:   12/13/22 1102  TempSrc:   PainSc: 0-No pain                 Bruno Leach,W. EDMOND

## 2022-12-13 NOTE — Anesthesia Procedure Notes (Signed)
Procedure Name: LMA Insertion Date/Time: 12/13/2022 9:25 AM  Performed by: Cleda Clarks, CRNAPre-anesthesia Checklist: Patient identified, Emergency Drugs available, Suction available and Patient being monitored Patient Re-evaluated:Patient Re-evaluated prior to induction Oxygen Delivery Method: Circle system utilized Preoxygenation: Pre-oxygenation with 100% oxygen Induction Type: IV induction Ventilation: Mask ventilation without difficulty LMA: LMA inserted LMA Size: 3.0 Number of attempts: 1 Placement Confirmation: positive ETCO2 Tube secured with: Tape Dental Injury: Teeth and Oropharynx as per pre-operative assessment

## 2022-12-13 NOTE — Interval H&P Note (Signed)
History and Physical Interval Note:  12/13/2022 8:24 AM  Kaitlin Moses  has presented today for surgery, with the diagnosis of Postmenopausal Bleeding.  The various methods of treatment have been discussed with the patient and family. After consideration of risks, benefits and other options for treatment, the patient has consented to  Procedure(s): DILATATION & CURETTAGE/HYSTEROSCOPY WITH POSSIBLE MYOSURE POLYPECTOMY (N/A) as a surgical intervention.  The patient's history has been reviewed, patient examined, no change in status, stable for surgery.  I have reviewed the patient's chart and labs.  Questions were answered to the patient's satisfaction.     Steva Ready

## 2022-12-13 NOTE — Transfer of Care (Signed)
Immediate Anesthesia Transfer of Care Note  Patient: Kaitlin Moses  Procedure(s) Performed: DILATATION & CURETTAGE/HYSTEROSCOPY WITH MYOSURE POLYPECTOMY (Vagina )  Patient Location: PACU  Anesthesia Type:General  Level of Consciousness: awake, alert , and oriented  Airway & Oxygen Therapy: Patient Spontanous Breathing and Patient connected to face mask oxygen  Post-op Assessment: Report given to RN and Post -op Vital signs reviewed and stable  Post vital signs: Reviewed and stable  Last Vitals:  Vitals Value Taken Time  BP 113/66 12/13/22 0953  Temp    Pulse 59 12/13/22 0956  Resp 18 12/13/22 0956  SpO2 100 % 12/13/22 0956  Vitals shown include unvalidated device data.  Last Pain:  Vitals:   12/13/22 0705  TempSrc: Oral  PainSc: 3       Patients Stated Pain Goal: 3 (12/13/22 0705)  Complications: No notable events documented.

## 2022-12-14 LAB — SURGICAL PATHOLOGY

## 2022-12-15 ENCOUNTER — Encounter (HOSPITAL_BASED_OUTPATIENT_CLINIC_OR_DEPARTMENT_OTHER): Payer: Self-pay | Admitting: Obstetrics and Gynecology

## 2022-12-17 ENCOUNTER — Ambulatory Visit
Admission: RE | Admit: 2022-12-17 | Discharge: 2022-12-17 | Disposition: A | Discharge status: Home / Self Care | Payer: BC Managed Care – PPO | Source: Ambulatory Visit | Attending: Sports Medicine | Admitting: Sports Medicine

## 2022-12-17 DIAGNOSIS — Z981 Arthrodesis status: Secondary | ICD-10-CM

## 2022-12-17 DIAGNOSIS — M542 Cervicalgia: Secondary | ICD-10-CM | POA: Diagnosis not present

## 2022-12-17 DIAGNOSIS — M2550 Pain in unspecified joint: Secondary | ICD-10-CM

## 2022-12-17 DIAGNOSIS — R768 Other specified abnormal immunological findings in serum: Secondary | ICD-10-CM

## 2022-12-17 DIAGNOSIS — M4802 Spinal stenosis, cervical region: Secondary | ICD-10-CM | POA: Diagnosis not present

## 2022-12-17 DIAGNOSIS — G8929 Other chronic pain: Secondary | ICD-10-CM

## 2022-12-25 ENCOUNTER — Other Ambulatory Visit: Payer: Self-pay

## 2022-12-25 DIAGNOSIS — G43709 Chronic migraine without aura, not intractable, without status migrainosus: Secondary | ICD-10-CM

## 2022-12-25 MED ORDER — BOTOX 200 UNITS IJ SOLR: INTRAMUSCULAR | 4 refills | Status: DC

## 2022-12-25 NOTE — Telephone Encounter (Signed)
Per [facility] New script needed.

## 2022-12-27 DIAGNOSIS — N857 Hematometra: Secondary | ICD-10-CM | POA: Diagnosis not present

## 2022-12-28 DIAGNOSIS — F4323 Adjustment disorder with mixed anxiety and depressed mood: Secondary | ICD-10-CM | POA: Diagnosis not present

## 2023-01-12 ENCOUNTER — Ambulatory Visit (INDEPENDENT_AMBULATORY_CARE_PROVIDER_SITE_OTHER): Payer: BC Managed Care – PPO | Admitting: Neurology

## 2023-01-12 DIAGNOSIS — G43709 Chronic migraine without aura, not intractable, without status migrainosus: Secondary | ICD-10-CM | POA: Diagnosis not present

## 2023-01-12 MED ORDER — ONABOTULINUMTOXINA 100 UNITS IJ SOLR
200.0000 [IU] | Freq: Once | INTRAMUSCULAR | Status: DC
Start: 2023-01-12 — End: 2023-01-12

## 2023-01-12 MED ORDER — ONABOTULINUMTOXINA 100 UNITS IJ SOLR
200.0000 [IU] | Freq: Once | INTRAMUSCULAR | Status: AC
Start: 2023-01-12 — End: 2023-01-12
  Administered 2023-01-12: 155 [IU] via INTRAMUSCULAR

## 2023-01-12 NOTE — Progress Notes (Signed)
[facility]  ° °Procedure Note Botox ° °Attending: Dr. Jenine Krisher ° °Preoperative Diagnosis(es): Chronic migraine ° °Consent obtained from: The patient °Benefits discussed included, but were not limited to decreased muscle tightness, increased joint range of motion, and decreased pain.  Risk discussed included, but were not limited pain and discomfort, bleeding, bruising, excessive weakness, venous thrombosis, muscle atrophy and dysphagia.  Anticipated outcomes of the procedure as well as he risks and benefits of the alternatives to the procedure, and the roles and tasks of the personnel to be involved, were discussed with the patient, and the patient consents to the procedure and agrees to proceed. A copy of the patient medication guide was given to the patient which explains the blackbox warning. ° °Patients identity and treatment sites confirmed Yes.  . ° °Details of Procedure: °Skin was cleaned with alcohol. Prior to injection, the needle plunger was aspirated to make sure the needle was not within a blood vessel.  There was no blood retrieved on aspiration.   ° °Following is a summary of the muscles injected  And the amount of Botulinum toxin used: ° °Dilution °200 units of Botox was reconstituted with 4 ml of preservative free normal saline. °Time of reconstitution: At the time of the office visit (<30 minutes prior to injection)  ° °Injections  °155 total units of Botox was injected with a 30 gauge needle. ° °Injection Sites: °L occipitalis: 15 units- 3 sites  °R occiptalis: 15 units- 3 sites ° °L upper trapezius: 15 units- 3 sites °R upper trapezius: 15 units- 3 sits          °L paraspinal: 10 units- 2 sites °R paraspinal: 10 units- 2 sites ° °Face °L frontalis(2 injection sites):10 units   °R frontalis(2 injection sites):10 units         °L corrugator: 5 units   °R corrugator: 5 units           °Procerus: 5 units   °L temporalis: 20 units °R temporalis: 20 units  ° °Agent:  °200 units of botulinum Type  A (Onobotulinum Toxin type A) was reconstituted with 4 ml of preservative free normal saline.  °Time of reconstitution: At the time of the office visit (<30 minutes prior to injection)  ° ° ° Total injected (Units):  155 ° Total wasted (Units):  45 ° °Patient tolerated procedure well without complications.   °Reinjection is anticipated in 3 months. ° ° °

## 2023-01-19 ENCOUNTER — Ambulatory Visit: Payer: BC Managed Care – PPO | Admitting: Neurology
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
## 2023-01-24 ENCOUNTER — Ambulatory Visit: Payer: BC Managed Care – PPO | Admitting: Neurology

## 2023-02-01 DIAGNOSIS — F4323 Adjustment disorder with mixed anxiety and depressed mood: Secondary | ICD-10-CM | POA: Diagnosis not present
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
## 2023-02-05 ENCOUNTER — Encounter: Payer: Self-pay | Admitting: Neurology

## 2023-02-05 ENCOUNTER — Ambulatory Visit (INDEPENDENT_AMBULATORY_CARE_PROVIDER_SITE_OTHER): Payer: BC Managed Care – PPO | Admitting: Neurology

## 2023-02-05 VITALS — BP 103/60 | HR 78 | Ht 63.0 in | Wt 113.0 lb

## 2023-02-05 DIAGNOSIS — G44219 Episodic tension-type headache, not intractable: Secondary | ICD-10-CM | POA: Diagnosis not present

## 2023-02-05 DIAGNOSIS — G43009 Migraine without aura, not intractable, without status migrainosus: Secondary | ICD-10-CM

## 2023-02-13 DIAGNOSIS — F4323 Adjustment disorder with mixed anxiety and depressed mood: Secondary | ICD-10-CM | POA: Diagnosis not present

## 2023-03-07 ENCOUNTER — Other Ambulatory Visit: Payer: Self-pay | Admitting: Family Medicine

## 2023-03-07 DIAGNOSIS — Z1231 Encounter for screening mammogram for malignant neoplasm of breast: Secondary | ICD-10-CM

## 2023-03-13 DIAGNOSIS — M9903 Segmental and somatic dysfunction of lumbar region: Secondary | ICD-10-CM | POA: Diagnosis not present

## 2023-03-13 DIAGNOSIS — M9901 Segmental and somatic dysfunction of cervical region: Secondary | ICD-10-CM | POA: Diagnosis not present

## 2023-03-13 DIAGNOSIS — M9904 Segmental and somatic dysfunction of sacral region: Secondary | ICD-10-CM | POA: Diagnosis not present

## 2023-03-13 DIAGNOSIS — F4323 Adjustment disorder with mixed anxiety and depressed mood: Secondary | ICD-10-CM | POA: Diagnosis not present

## 2023-03-13 DIAGNOSIS — M9902 Segmental and somatic dysfunction of thoracic region: Secondary | ICD-10-CM | POA: Diagnosis not present

## 2023-03-13 DIAGNOSIS — M531 Cervicobrachial syndrome: Secondary | ICD-10-CM | POA: Diagnosis not present

## 2023-03-14 ENCOUNTER — Ambulatory Visit (INDEPENDENT_AMBULATORY_CARE_PROVIDER_SITE_OTHER): Payer: BC Managed Care – PPO | Admitting: Sports Medicine

## 2023-03-14 VITALS — BP 118/70 | HR 87 | Ht 63.0 in | Wt 111.0 lb

## 2023-03-14 DIAGNOSIS — Z981 Arthrodesis status: Secondary | ICD-10-CM

## 2023-03-14 DIAGNOSIS — M546 Pain in thoracic spine: Secondary | ICD-10-CM

## 2023-03-14 DIAGNOSIS — M2550 Pain in unspecified joint: Secondary | ICD-10-CM

## 2023-03-14 DIAGNOSIS — M542 Cervicalgia: Secondary | ICD-10-CM | POA: Diagnosis not present

## 2023-03-14 DIAGNOSIS — G8929 Other chronic pain: Secondary | ICD-10-CM

## 2023-03-14 DIAGNOSIS — R768 Other specified abnormal immunological findings in serum: Secondary | ICD-10-CM | POA: Diagnosis not present
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
## 2023-03-14 NOTE — Patient Instructions (Addendum)
Good to see you  Trigger point injections given today   Follow up in 3 month

## 2023-03-19 DIAGNOSIS — F4323 Adjustment disorder with mixed anxiety and depressed mood: Secondary | ICD-10-CM | POA: Diagnosis not present

## 2023-03-20 DIAGNOSIS — M9903 Segmental and somatic dysfunction of lumbar region: Secondary | ICD-10-CM | POA: Diagnosis not present

## 2023-03-20 DIAGNOSIS — M9901 Segmental and somatic dysfunction of cervical region: Secondary | ICD-10-CM | POA: Diagnosis not present

## 2023-03-20 DIAGNOSIS — M9904 Segmental and somatic dysfunction of sacral region: Secondary | ICD-10-CM | POA: Diagnosis not present

## 2023-03-20 DIAGNOSIS — M531 Cervicobrachial syndrome: Secondary | ICD-10-CM | POA: Diagnosis not present

## 2023-03-20 DIAGNOSIS — F4323 Adjustment disorder with mixed anxiety and depressed mood: Secondary | ICD-10-CM | POA: Diagnosis not present

## 2023-03-27 DIAGNOSIS — M9903 Segmental and somatic dysfunction of lumbar region: Secondary | ICD-10-CM | POA: Diagnosis not present

## 2023-03-27 DIAGNOSIS — M531 Cervicobrachial syndrome: Secondary | ICD-10-CM | POA: Diagnosis not present

## 2023-03-27 DIAGNOSIS — M9901 Segmental and somatic dysfunction of cervical region: Secondary | ICD-10-CM | POA: Diagnosis not present

## 2023-03-27 DIAGNOSIS — M9904 Segmental and somatic dysfunction of sacral region: Secondary | ICD-10-CM | POA: Diagnosis not present

## 2023-03-28 ENCOUNTER — Telehealth: Payer: Self-pay

## 2023-03-28 NOTE — Telephone Encounter (Signed)
PA needed for Botox due 05/15/23

## 2023-04-05 ENCOUNTER — Other Ambulatory Visit (HOSPITAL_COMMUNITY): Payer: Self-pay

## 2023-04-05 DIAGNOSIS — F4323 Adjustment disorder with mixed anxiety and depressed mood: Secondary | ICD-10-CM | POA: Diagnosis not present

## 2023-04-13 ENCOUNTER — Ambulatory Visit (INDEPENDENT_AMBULATORY_CARE_PROVIDER_SITE_OTHER): Payer: BC Managed Care – PPO | Admitting: Neurology

## 2023-04-13 DIAGNOSIS — G43709 Chronic migraine without aura, not intractable, without status migrainosus: Secondary | ICD-10-CM

## 2023-04-13 DIAGNOSIS — G43009 Migraine without aura, not intractable, without status migrainosus: Secondary | ICD-10-CM

## 2023-04-13 MED ORDER — ONABOTULINUMTOXINA 100 UNITS IJ SOLR
200.0000 [IU] | Freq: Once | INTRAMUSCULAR | Status: AC
Start: 2023-04-13 — End: 2023-04-13
  Administered 2023-04-13: 155 [IU] via INTRAMUSCULAR

## 2023-04-13 NOTE — Progress Notes (Signed)

## 2023-04-21 DIAGNOSIS — R519 Headache, unspecified: Secondary | ICD-10-CM | POA: Diagnosis not present

## 2023-04-21 DIAGNOSIS — J020 Streptococcal pharyngitis: Secondary | ICD-10-CM | POA: Diagnosis not present

## 2023-04-21 DIAGNOSIS — Z03818 Encounter for observation for suspected exposure to other biological agents ruled out: Secondary | ICD-10-CM | POA: Diagnosis not present

## 2023-04-24 ENCOUNTER — Telehealth: Payer: Self-pay | Admitting: Neurology

## 2023-04-24 ENCOUNTER — Inpatient Hospital Stay: Admission: RE | Admit: 2023-04-24 | Payer: BC Managed Care – PPO | Source: Ambulatory Visit

## 2023-04-24 DIAGNOSIS — G43909 Migraine, unspecified, not intractable, without status migrainosus: Secondary | ICD-10-CM | POA: Diagnosis not present

## 2023-04-24 NOTE — Telephone Encounter (Signed)
Patient has been having more stressors over the last several weeks as the migraines have picked up. Patient was diagnosed with strep and that may have contributed as well. Patient would like to try sample of Nurtec and will come by today to get them

## 2023-04-24 NOTE — Telephone Encounter (Signed)
Pt's husband called in and left a message. He stated she had to go to Urgent Care last night. They recommended them give our office a call to see if the pt needs to do any follow up treatment?

## 2023-04-25 ENCOUNTER — Ambulatory Visit: Payer: Self-pay | Admitting: Podiatry

## 2023-04-26 DIAGNOSIS — F4323 Adjustment disorder with mixed anxiety and depressed mood: Secondary | ICD-10-CM | POA: Diagnosis not present

## 2023-05-11 ENCOUNTER — Telehealth: Payer: Self-pay | Admitting: Pharmacy Technician

## 2023-05-11 ENCOUNTER — Other Ambulatory Visit (HOSPITAL_COMMUNITY): Payer: Self-pay

## 2023-05-11 NOTE — Telephone Encounter (Signed)
Pharmacy Patient Advocate Encounter   Received notification from Pt Calls Messages that prior authorization for BOTOX 200 is required/requested.   Insurance verification completed.   The patient is insured through [employer] .   Per test claim: PA required; PA submitted to [employer] via CoverMyMeds Key/confirmation #/EOC [facility] Status is pending

## 2023-05-11 NOTE — Telephone Encounter (Signed)
Pharmacy Patient Advocate Encounter  Received notification from [employer] that Prior Authorization for BOTOX 200 has been APPROVED from 9.18.24 to 10.18.25   PA #/Case ID/Reference #: [id number]

## 2023-05-11 NOTE — Telephone Encounter (Signed)
PA has been submitted, and telephone encounter has been created. PA has been APPROVED. Test billing with [facility] says it was last filled on 9.13.24. Next fill is not until 11.19.24

## 2023-05-22 DIAGNOSIS — F4323 Adjustment disorder with mixed anxiety and depressed mood: Secondary | ICD-10-CM | POA: Diagnosis not present

## 2023-06-01 DIAGNOSIS — N898 Other specified noninflammatory disorders of vagina: Secondary | ICD-10-CM | POA: Diagnosis not present

## 2023-06-01 DIAGNOSIS — G4700 Insomnia, unspecified: Secondary | ICD-10-CM | POA: Diagnosis not present

## 2023-06-01 DIAGNOSIS — M797 Fibromyalgia: Secondary | ICD-10-CM | POA: Diagnosis not present

## 2023-06-13 DIAGNOSIS — F4323 Adjustment disorder with mixed anxiety and depressed mood: Secondary | ICD-10-CM | POA: Diagnosis not present
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
## 2023-06-14 ENCOUNTER — Ambulatory Visit (INDEPENDENT_AMBULATORY_CARE_PROVIDER_SITE_OTHER): Payer: BC Managed Care – PPO | Admitting: Sports Medicine

## 2023-06-14 VITALS — BP 120/80 | Ht 63.0 in | Wt 118.0 lb

## 2023-06-14 DIAGNOSIS — M542 Cervicalgia: Secondary | ICD-10-CM

## 2023-06-14 DIAGNOSIS — Z981 Arthrodesis status: Secondary | ICD-10-CM | POA: Diagnosis not present

## 2023-06-14 DIAGNOSIS — R768 Other specified abnormal immunological findings in serum: Secondary | ICD-10-CM

## 2023-06-14 DIAGNOSIS — M2550 Pain in unspecified joint: Secondary | ICD-10-CM | POA: Diagnosis not present

## 2023-06-14 DIAGNOSIS — R7689 Other specified abnormal immunological findings in serum: Secondary | ICD-10-CM

## 2023-06-14 DIAGNOSIS — G8929 Other chronic pain: Secondary | ICD-10-CM

## 2023-06-14 DIAGNOSIS — M546 Pain in thoracic spine: Secondary | ICD-10-CM

## 2023-06-25 DIAGNOSIS — F4323 Adjustment disorder with mixed anxiety and depressed mood: Secondary | ICD-10-CM | POA: Diagnosis not present

## 2023-07-02 DIAGNOSIS — F4323 Adjustment disorder with mixed anxiety and depressed mood: Secondary | ICD-10-CM | POA: Diagnosis not present

## 2023-07-03 DIAGNOSIS — B001 Herpesviral vesicular dermatitis: Secondary | ICD-10-CM | POA: Diagnosis not present

## 2023-07-03 DIAGNOSIS — J329 Chronic sinusitis, unspecified: Secondary | ICD-10-CM | POA: Diagnosis not present

## 2023-07-10 DIAGNOSIS — F4323 Adjustment disorder with mixed anxiety and depressed mood: Secondary | ICD-10-CM | POA: Diagnosis not present

## 2023-07-13 ENCOUNTER — Ambulatory Visit (INDEPENDENT_AMBULATORY_CARE_PROVIDER_SITE_OTHER): Payer: BC Managed Care – PPO | Admitting: Neurology

## 2023-07-13 DIAGNOSIS — G43709 Chronic migraine without aura, not intractable, without status migrainosus: Secondary | ICD-10-CM

## 2023-07-13 MED ORDER — ONABOTULINUMTOXINA 100 UNITS IJ SOLR
200.0000 [IU] | Freq: Once | INTRAMUSCULAR | Status: AC
Start: 2023-07-13 — End: 2023-07-13
  Administered 2023-07-13: 155 [IU] via INTRAMUSCULAR

## 2023-07-13 NOTE — Progress Notes (Signed)

## 2023-07-23 DIAGNOSIS — F4323 Adjustment disorder with mixed anxiety and depressed mood: Secondary | ICD-10-CM | POA: Diagnosis not present

## 2023-09-20 DIAGNOSIS — H00014 Hordeolum externum left upper eyelid: Secondary | ICD-10-CM | POA: Diagnosis not present

## 2023-09-20 DIAGNOSIS — F5101 Primary insomnia: Secondary | ICD-10-CM | POA: Diagnosis not present

## 2023-10-12 ENCOUNTER — Ambulatory Visit: Payer: BC Managed Care – PPO | Admitting: Neurology

## 2023-10-25 DIAGNOSIS — H1032 Unspecified acute conjunctivitis, left eye: Secondary | ICD-10-CM | POA: Diagnosis not present

## 2023-11-02 DIAGNOSIS — M79671 Pain in right foot: Secondary | ICD-10-CM | POA: Diagnosis not present

## 2023-11-02 DIAGNOSIS — M79672 Pain in left foot: Secondary | ICD-10-CM | POA: Diagnosis not present

## 2023-11-06 DIAGNOSIS — H1189 Other specified disorders of conjunctiva: Secondary | ICD-10-CM | POA: Diagnosis not present

## 2023-11-16 ENCOUNTER — Telehealth: Payer: Self-pay | Admitting: Neurology

## 2023-11-16 NOTE — Telephone Encounter (Signed)
 I was calling patient to schedule with [facility] for a new referral we received. She did not want to schedule till she spoke with someone about the problems she is having. She mentioned her TMJ and the dentist.

## 2023-11-16 NOTE — Telephone Encounter (Signed)
 LMOVM for patient to call the office back.
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
## 2023-12-07 ENCOUNTER — Encounter: Payer: Self-pay | Admitting: Neurology

## 2023-12-07 ENCOUNTER — Ambulatory Visit (INDEPENDENT_AMBULATORY_CARE_PROVIDER_SITE_OTHER): Payer: Self-pay | Admitting: Neurology

## 2023-12-07 ENCOUNTER — Telehealth: Payer: Self-pay

## 2023-12-07 VITALS — BP 120/65 | HR 64 | Ht 63.0 in | Wt 119.0 lb

## 2023-12-07 DIAGNOSIS — G44219 Episodic tension-type headache, not intractable: Secondary | ICD-10-CM

## 2023-12-07 DIAGNOSIS — R202 Paresthesia of skin: Secondary | ICD-10-CM | POA: Diagnosis not present

## 2023-12-07 DIAGNOSIS — R200 Anesthesia of skin: Secondary | ICD-10-CM

## 2023-12-07 DIAGNOSIS — G43009 Migraine without aura, not intractable, without status migrainosus: Secondary | ICD-10-CM | POA: Diagnosis not present

## 2023-12-07 MED ORDER — AIMOVIG 140 MG/ML ~~LOC~~ SOAJ: 140.0000 mg | SUBCUTANEOUS | 5 refills | Status: DC

## 2023-12-07 NOTE — Patient Instructions (Addendum)
 Plan to start Aimovig injection every 28 days.  If no improvement in 3 months, contact me Nerve conduction study of right arm and leg Follow up 6 months

## 2023-12-07 NOTE — Telephone Encounter (Signed)
 Patient seen in office today. Per Dr.Jaffe cancel Botox  200 units every 90 days.  Patient supply given to her at there visit.

## 2023-12-10 ENCOUNTER — Telehealth: Payer: Self-pay | Admitting: Pharmacy Technician

## 2023-12-10 ENCOUNTER — Other Ambulatory Visit (HOSPITAL_COMMUNITY): Payer: Self-pay

## 2023-12-10 NOTE — Telephone Encounter (Signed)
 Pharmacy Patient Advocate Encounter   Received notification from Pt Calls Messages that prior authorization for AIMOVIG  140MG  is required/requested.   Insurance verification completed.   The patient is insured through [employer] .   Per test claim: PA required; PA submitted to above mentioned insurance via CoverMyMeds Key/confirmation #/EOC [id number] Status is pending

## 2023-12-10 NOTE — Telephone Encounter (Signed)
 Pharmacy Patient Advocate Encounter  Received notification from [employer] that Prior Authorization for AIMOVIG  140MG  has been APPROVED from 4.19.25 to 5.19.26. Ran test claim, Copay is $50. This test claim was processed through [facility]- copay amounts may vary at other pharmacies due to pharmacy/plan contracts, or as the patient moves through the different stages of their insurance plan.   PA #/Case ID/Reference #: [id number]

## 2023-12-10 NOTE — Telephone Encounter (Signed)
 PA has been submitted for Aimovig , and has been approved.

## 2023-12-12 DIAGNOSIS — H00015 Hordeolum externum left lower eyelid: Secondary | ICD-10-CM | POA: Diagnosis not present

## 2023-12-12 DIAGNOSIS — H00012 Hordeolum externum right lower eyelid: Secondary | ICD-10-CM | POA: Diagnosis not present

## 2023-12-13 DIAGNOSIS — M5134 Other intervertebral disc degeneration, thoracic region: Secondary | ICD-10-CM | POA: Diagnosis not present

## 2023-12-13 DIAGNOSIS — M50123 Cervical disc disorder at C6-C7 level with radiculopathy: Secondary | ICD-10-CM | POA: Diagnosis not present

## 2023-12-13 DIAGNOSIS — M9901 Segmental and somatic dysfunction of cervical region: Secondary | ICD-10-CM | POA: Diagnosis not present

## 2023-12-13 DIAGNOSIS — M5136 Other intervertebral disc degeneration, lumbar region with discogenic back pain only: Secondary | ICD-10-CM | POA: Diagnosis not present

## 2023-12-13 DIAGNOSIS — M5032 Other cervical disc degeneration, mid-cervical region, unspecified level: Secondary | ICD-10-CM | POA: Diagnosis not present

## 2023-12-13 DIAGNOSIS — M5117 Intervertebral disc disorders with radiculopathy, lumbosacral region: Secondary | ICD-10-CM | POA: Diagnosis not present

## 2023-12-13 DIAGNOSIS — M50122 Cervical disc disorder at C5-C6 level with radiculopathy: Secondary | ICD-10-CM | POA: Diagnosis not present

## 2023-12-13 DIAGNOSIS — M9903 Segmental and somatic dysfunction of lumbar region: Secondary | ICD-10-CM | POA: Diagnosis not present

## 2023-12-13 DIAGNOSIS — M9902 Segmental and somatic dysfunction of thoracic region: Secondary | ICD-10-CM | POA: Diagnosis not present

## 2024-01-02 ENCOUNTER — Ambulatory Visit: Admitting: Neurology

## 2024-01-03 DIAGNOSIS — M50123 Cervical disc disorder at C6-C7 level with radiculopathy: Secondary | ICD-10-CM | POA: Diagnosis not present

## 2024-01-03 DIAGNOSIS — M5032 Other cervical disc degeneration, mid-cervical region, unspecified level: Secondary | ICD-10-CM | POA: Diagnosis not present

## 2024-01-03 DIAGNOSIS — M50122 Cervical disc disorder at C5-C6 level with radiculopathy: Secondary | ICD-10-CM | POA: Diagnosis not present

## 2024-01-03 DIAGNOSIS — M5117 Intervertebral disc disorders with radiculopathy, lumbosacral region: Secondary | ICD-10-CM | POA: Diagnosis not present

## 2024-01-04 DIAGNOSIS — Z01419 Encounter for gynecological examination (general) (routine) without abnormal findings: Secondary | ICD-10-CM | POA: Diagnosis not present

## 2024-01-07 DIAGNOSIS — M50123 Cervical disc disorder at C6-C7 level with radiculopathy: Secondary | ICD-10-CM | POA: Diagnosis not present

## 2024-01-07 DIAGNOSIS — M5117 Intervertebral disc disorders with radiculopathy, lumbosacral region: Secondary | ICD-10-CM | POA: Diagnosis not present

## 2024-01-07 DIAGNOSIS — M50122 Cervical disc disorder at C5-C6 level with radiculopathy: Secondary | ICD-10-CM | POA: Diagnosis not present

## 2024-01-07 DIAGNOSIS — M5032 Other cervical disc degeneration, mid-cervical region, unspecified level: Secondary | ICD-10-CM | POA: Diagnosis not present

## 2024-01-10 DIAGNOSIS — M50123 Cervical disc disorder at C6-C7 level with radiculopathy: Secondary | ICD-10-CM | POA: Diagnosis not present

## 2024-01-10 DIAGNOSIS — M50122 Cervical disc disorder at C5-C6 level with radiculopathy: Secondary | ICD-10-CM | POA: Diagnosis not present

## 2024-01-10 DIAGNOSIS — M5117 Intervertebral disc disorders with radiculopathy, lumbosacral region: Secondary | ICD-10-CM | POA: Diagnosis not present

## 2024-01-10 DIAGNOSIS — M5032 Other cervical disc degeneration, mid-cervical region, unspecified level: Secondary | ICD-10-CM | POA: Diagnosis not present

## 2024-01-11 ENCOUNTER — Ambulatory Visit: Admitting: Neurology

## 2024-01-14 DIAGNOSIS — M5032 Other cervical disc degeneration, mid-cervical region, unspecified level: Secondary | ICD-10-CM | POA: Diagnosis not present

## 2024-01-14 DIAGNOSIS — H18413 Arcus senilis, bilateral: Secondary | ICD-10-CM | POA: Diagnosis not present

## 2024-01-14 DIAGNOSIS — Z961 Presence of intraocular lens: Secondary | ICD-10-CM | POA: Diagnosis not present

## 2024-01-14 DIAGNOSIS — H16223 Keratoconjunctivitis sicca, not specified as Sjogren's, bilateral: Secondary | ICD-10-CM | POA: Diagnosis not present

## 2024-01-14 DIAGNOSIS — M5117 Intervertebral disc disorders with radiculopathy, lumbosacral region: Secondary | ICD-10-CM | POA: Diagnosis not present

## 2024-01-14 DIAGNOSIS — H0288B Meibomian gland dysfunction left eye, upper and lower eyelids: Secondary | ICD-10-CM | POA: Diagnosis not present

## 2024-01-14 DIAGNOSIS — M50123 Cervical disc disorder at C6-C7 level with radiculopathy: Secondary | ICD-10-CM | POA: Diagnosis not present

## 2024-01-14 DIAGNOSIS — M50122 Cervical disc disorder at C5-C6 level with radiculopathy: Secondary | ICD-10-CM | POA: Diagnosis not present

## 2024-01-17 ENCOUNTER — Encounter: Payer: Self-pay | Admitting: Neurology

## 2024-01-17 DIAGNOSIS — M5117 Intervertebral disc disorders with radiculopathy, lumbosacral region: Secondary | ICD-10-CM | POA: Diagnosis not present

## 2024-01-17 DIAGNOSIS — M5032 Other cervical disc degeneration, mid-cervical region, unspecified level: Secondary | ICD-10-CM | POA: Diagnosis not present

## 2024-01-17 DIAGNOSIS — M50123 Cervical disc disorder at C6-C7 level with radiculopathy: Secondary | ICD-10-CM | POA: Diagnosis not present

## 2024-01-17 DIAGNOSIS — M50122 Cervical disc disorder at C5-C6 level with radiculopathy: Secondary | ICD-10-CM | POA: Diagnosis not present

## 2024-01-21 DIAGNOSIS — M5032 Other cervical disc degeneration, mid-cervical region, unspecified level: Secondary | ICD-10-CM | POA: Diagnosis not present

## 2024-01-21 DIAGNOSIS — M50122 Cervical disc disorder at C5-C6 level with radiculopathy: Secondary | ICD-10-CM | POA: Diagnosis not present

## 2024-01-21 DIAGNOSIS — M5117 Intervertebral disc disorders with radiculopathy, lumbosacral region: Secondary | ICD-10-CM | POA: Diagnosis not present

## 2024-01-21 DIAGNOSIS — M50123 Cervical disc disorder at C6-C7 level with radiculopathy: Secondary | ICD-10-CM | POA: Diagnosis not present

## 2024-01-24 DIAGNOSIS — M50123 Cervical disc disorder at C6-C7 level with radiculopathy: Secondary | ICD-10-CM | POA: Diagnosis not present

## 2024-01-24 DIAGNOSIS — M5032 Other cervical disc degeneration, mid-cervical region, unspecified level: Secondary | ICD-10-CM | POA: Diagnosis not present

## 2024-01-24 DIAGNOSIS — M5117 Intervertebral disc disorders with radiculopathy, lumbosacral region: Secondary | ICD-10-CM | POA: Diagnosis not present

## 2024-01-24 DIAGNOSIS — M50122 Cervical disc disorder at C5-C6 level with radiculopathy: Secondary | ICD-10-CM | POA: Diagnosis not present

## 2024-01-28 DIAGNOSIS — M5117 Intervertebral disc disorders with radiculopathy, lumbosacral region: Secondary | ICD-10-CM | POA: Diagnosis not present

## 2024-01-28 DIAGNOSIS — M50122 Cervical disc disorder at C5-C6 level with radiculopathy: Secondary | ICD-10-CM | POA: Diagnosis not present

## 2024-01-28 DIAGNOSIS — M50123 Cervical disc disorder at C6-C7 level with radiculopathy: Secondary | ICD-10-CM | POA: Diagnosis not present

## 2024-01-28 DIAGNOSIS — M5032 Other cervical disc degeneration, mid-cervical region, unspecified level: Secondary | ICD-10-CM | POA: Diagnosis not present

## 2024-01-31 DIAGNOSIS — M5032 Other cervical disc degeneration, mid-cervical region, unspecified level: Secondary | ICD-10-CM | POA: Diagnosis not present

## 2024-01-31 DIAGNOSIS — M5117 Intervertebral disc disorders with radiculopathy, lumbosacral region: Secondary | ICD-10-CM | POA: Diagnosis not present

## 2024-01-31 DIAGNOSIS — M50122 Cervical disc disorder at C5-C6 level with radiculopathy: Secondary | ICD-10-CM | POA: Diagnosis not present

## 2024-01-31 DIAGNOSIS — M50123 Cervical disc disorder at C6-C7 level with radiculopathy: Secondary | ICD-10-CM | POA: Diagnosis not present

## 2024-02-04 DIAGNOSIS — M50122 Cervical disc disorder at C5-C6 level with radiculopathy: Secondary | ICD-10-CM | POA: Diagnosis not present

## 2024-02-04 DIAGNOSIS — M50123 Cervical disc disorder at C6-C7 level with radiculopathy: Secondary | ICD-10-CM | POA: Diagnosis not present

## 2024-02-04 DIAGNOSIS — M5117 Intervertebral disc disorders with radiculopathy, lumbosacral region: Secondary | ICD-10-CM | POA: Diagnosis not present

## 2024-02-04 DIAGNOSIS — M5032 Other cervical disc degeneration, mid-cervical region, unspecified level: Secondary | ICD-10-CM | POA: Diagnosis not present

## 2024-02-07 DIAGNOSIS — M5117 Intervertebral disc disorders with radiculopathy, lumbosacral region: Secondary | ICD-10-CM | POA: Diagnosis not present

## 2024-02-07 DIAGNOSIS — M5032 Other cervical disc degeneration, mid-cervical region, unspecified level: Secondary | ICD-10-CM | POA: Diagnosis not present

## 2024-02-07 DIAGNOSIS — M50123 Cervical disc disorder at C6-C7 level with radiculopathy: Secondary | ICD-10-CM | POA: Diagnosis not present

## 2024-02-07 DIAGNOSIS — M50122 Cervical disc disorder at C5-C6 level with radiculopathy: Secondary | ICD-10-CM | POA: Diagnosis not present

## 2024-02-15 DIAGNOSIS — H01009 Unspecified blepharitis unspecified eye, unspecified eyelid: Secondary | ICD-10-CM | POA: Diagnosis not present

## 2024-02-15 DIAGNOSIS — H02889 Meibomian gland dysfunction of unspecified eye, unspecified eyelid: Secondary | ICD-10-CM | POA: Diagnosis not present

## 2024-02-15 DIAGNOSIS — H16233 Neurotrophic keratoconjunctivitis, bilateral: Secondary | ICD-10-CM | POA: Diagnosis not present

## 2024-04-19 DIAGNOSIS — J029 Acute pharyngitis, unspecified: Secondary | ICD-10-CM | POA: Diagnosis not present

## 2024-04-19 DIAGNOSIS — Z03818 Encounter for observation for suspected exposure to other biological agents ruled out: Secondary | ICD-10-CM | POA: Diagnosis not present

## 2024-04-30 DIAGNOSIS — H02889 Meibomian gland dysfunction of unspecified eye, unspecified eyelid: Secondary | ICD-10-CM | POA: Diagnosis not present

## 2024-04-30 DIAGNOSIS — H0011 Chalazion right upper eyelid: Secondary | ICD-10-CM | POA: Diagnosis not present

## 2024-04-30 DIAGNOSIS — H16233 Neurotrophic keratoconjunctivitis, bilateral: Secondary | ICD-10-CM | POA: Diagnosis not present

## 2024-04-30 DIAGNOSIS — H0222C Mechanical lagophthalmos, bilateral, upper and lower eyelids: Secondary | ICD-10-CM | POA: Diagnosis not present

## 2024-05-21 DIAGNOSIS — H0011 Chalazion right upper eyelid: Secondary | ICD-10-CM | POA: Diagnosis not present

## 2024-05-21 DIAGNOSIS — H0222C Mechanical lagophthalmos, bilateral, upper and lower eyelids: Secondary | ICD-10-CM | POA: Diagnosis not present

## 2024-05-21 DIAGNOSIS — H16223 Keratoconjunctivitis sicca, not specified as Sjogren's, bilateral: Secondary | ICD-10-CM | POA: Diagnosis not present

## 2024-05-21 DIAGNOSIS — H02889 Meibomian gland dysfunction of unspecified eye, unspecified eyelid: Secondary | ICD-10-CM | POA: Diagnosis not present

## 2024-06-16 DIAGNOSIS — H04129 Dry eye syndrome of unspecified lacrimal gland: Secondary | ICD-10-CM | POA: Diagnosis not present

## 2024-06-16 DIAGNOSIS — G8929 Other chronic pain: Secondary | ICD-10-CM | POA: Diagnosis not present

## 2024-06-16 DIAGNOSIS — M797 Fibromyalgia: Secondary | ICD-10-CM | POA: Diagnosis not present

## 2024-06-16 DIAGNOSIS — G4700 Insomnia, unspecified: Secondary | ICD-10-CM | POA: Diagnosis not present
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
## 2024-07-02 ENCOUNTER — Ambulatory Visit: Payer: Self-pay | Admitting: Neurology

## 2024-07-02 ENCOUNTER — Telehealth: Payer: Self-pay

## 2024-07-02 VITALS — BP 126/67 | HR 71 | Wt 118.0 lb

## 2024-07-02 DIAGNOSIS — R200 Anesthesia of skin: Secondary | ICD-10-CM | POA: Diagnosis not present

## 2024-07-02 DIAGNOSIS — G43009 Migraine without aura, not intractable, without status migrainosus: Secondary | ICD-10-CM | POA: Diagnosis not present

## 2024-07-02 DIAGNOSIS — R202 Paresthesia of skin: Secondary | ICD-10-CM | POA: Diagnosis not present

## 2024-07-02 MED ORDER — EMGALITY 120 MG/ML ~~LOC~~ SOAJ: 240.0000 mg | SUBCUTANEOUS | 0 refills | Status: DC

## 2024-07-02 NOTE — Patient Instructions (Addendum)
 Plan to start Emgality - take 2 injections for first dose (2 pens).  When you pick up first dose, contact me and I will send in prescription for standing order of 1 injection every 28 days Reschedule nerve study of right arm and leg.  ELECTROMYOGRAM AND NERVE CONDUCTION STUDIES (EMG/NCS) INSTRUCTIONS  How to Prepare The neurologist conducting the EMG will need to know if you have certain medical conditions. Tell the neurologist and other EMG lab personnel if you: Have a pacemaker or any other electrical medical device Take blood-thinning medications Have hemophilia, a blood-clotting disorder that causes prolonged bleeding Bathing Take a shower or bath shortly before your exam in order to remove oils from your skin. Dont apply lotions or creams before the exam.  What to Expect Youll likely be asked to change into a hospital gown for the procedure and lie down on an examination table. The following explanations can help you understand what will happen during the exam.  Electrodes. The neurologist or a technician places surface electrodes at various locations on your skin depending on where youre experiencing symptoms. Or the neurologist may insert needle electrodes at different sites depending on your symptoms.  Sensations. The electrodes will at times transmit a tiny electrical current that you may feel as a twinge or spasm. The needle electrode may cause discomfort or pain that usually ends shortly after the needle is removed. If you are concerned about discomfort or pain, you may want to talk to the neurologist about taking a short break during the exam.  Instructions. During the needle EMG, the neurologist will assess whether there is any spontaneous electrical activity when the muscle is at rest - activity that isnt present in healthy muscle tissue - and the degree of activity when you slightly contract the muscle.  He or she will give you instructions on resting and contracting a muscle at  appropriate times. Depending on what muscles and nerves the neurologist is examining, he or she may ask you to change positions during the exam.  After your EMG You may experience some temporary, minor bruising where the needle electrode was inserted into your muscle. This bruising should fade within several days. If it persists, contact your primary care doctor.   Follow up 6 months.

## 2024-07-02 NOTE — Telephone Encounter (Signed)
 PA needed [employer]

## 2024-07-03 ENCOUNTER — Other Ambulatory Visit (HOSPITAL_COMMUNITY): Payer: Self-pay

## 2024-07-03 ENCOUNTER — Telehealth: Payer: Self-pay | Admitting: Pharmacy Technician

## 2024-07-03 ENCOUNTER — Encounter: Payer: Self-pay | Admitting: Neurology

## 2024-07-03 DIAGNOSIS — G4700 Insomnia, unspecified: Secondary | ICD-10-CM | POA: Diagnosis not present

## 2024-07-03 DIAGNOSIS — G8929 Other chronic pain: Secondary | ICD-10-CM | POA: Diagnosis not present

## 2024-07-03 DIAGNOSIS — G4730 Sleep apnea, unspecified: Secondary | ICD-10-CM | POA: Diagnosis not present

## 2024-07-03 NOTE — Telephone Encounter (Signed)
 PA has been submitted, and telephone encounter has been created. Please see telephone encounter dated 12.11.25.

## 2024-07-03 NOTE — Telephone Encounter (Signed)
 Pharmacy Patient Advocate Encounter   Received notification from Pt Calls Messages that prior authorization for [facility] 120MG  is required/requested.   Insurance verification completed.   The patient is insured through [employer].   Per test claim: PA required; PA submitted to above mentioned insurance via Latent Key/confirmation #/EOC [id number] Status is pending

## 2024-07-04 ENCOUNTER — Other Ambulatory Visit (HOSPITAL_COMMUNITY): Payer: Self-pay

## 2024-07-04 NOTE — Telephone Encounter (Signed)
 Pharmacy Patient Advocate Encounter  Received notification from [facility] that Prior Authorization for [facility] 120MG  has been APPROVED from 11.11.25 to 1.10.26. Ran test claim, Copay is $50. This test claim was processed through [facility]- copay amounts may vary at other pharmacies due to pharmacy/plan contracts, or as the patient moves through the different stages of their insurance plan.   PA #/Case ID/Reference #: [id number]   Additional PA approval for the maintenance dose is approved from 11..11.25 to 12.11.26 Case ID: [id number] for 1ml per 30 day

## 2024-07-09 ENCOUNTER — Telehealth: Payer: Self-pay | Admitting: Neurology

## 2024-07-09 MED ORDER — EMGALITY 120 MG/ML ~~LOC~~ SOAJ: 120.0000 mg | SUBCUTANEOUS | 11 refills | Status: DC

## 2024-07-09 NOTE — Telephone Encounter (Signed)
 Kaitlin Moses; self  PH: [phone]  Lvm need to talk with Nurse to order/ call in for this medication( did not provide the name of the medication).

## 2024-07-09 NOTE — Telephone Encounter (Signed)
 Patient just called to advised we can send in her refills of her Emgality  to  [facility].

## 2024-07-10 ENCOUNTER — Telehealth: Payer: Self-pay | Admitting: Neurology

## 2024-07-10 NOTE — Telephone Encounter (Signed)
 Pt. Wants call bk, did not leave any details on VM

## 2024-07-11 MED ORDER — EMGALITY 120 MG/ML ~~LOC~~ SOAJ: 120.0000 mg | SUBCUTANEOUS | 2 refills | Status: AC

## 2024-07-11 NOTE — Telephone Encounter (Signed)
 Patient husband advised a 90 day supply has to be sent into the [employer] script for the insurance to cover.  90 day supply sent in,

## 2024-07-11 NOTE — Addendum Note (Signed)
 Addended by: OZELL JESUSA PARAS on: 07/11/2024 02:05 PM   Modules accepted: Orders

## 2024-07-29 ENCOUNTER — Other Ambulatory Visit: Payer: Self-pay | Admitting: Neurology

## 2024-07-31 ENCOUNTER — Ambulatory Visit (INDEPENDENT_AMBULATORY_CARE_PROVIDER_SITE_OTHER): Admitting: Neurology

## 2024-07-31 DIAGNOSIS — R200 Anesthesia of skin: Secondary | ICD-10-CM | POA: Diagnosis not present

## 2024-07-31 DIAGNOSIS — R202 Paresthesia of skin: Secondary | ICD-10-CM | POA: Diagnosis not present

## 2024-07-31 NOTE — Procedures (Signed)
 " [facility]  [address] [place], [address]  [place], [place] [address] Tel: [phone] Fax: [phone] Test Date:  07/31/2024  Patient: Kaitlin Moses DOB: 09-21-1960 Physician: Tonita Blanch, DO  Sex: Female Height: 5' 3 Ref Phys: Juliene Dunnings, DO  ID#: [id number]   Technician:    History: This is a 64 year old female referred for evaluation of paresthesias of the arms and legs.  NCV & EMG Findings: Extensive electrodiagnostic testing of the right upper and lower extremity shows:  All sensory responses including the right median, ulnar, mixed palmar, sural, and superficial peroneal nerves are within normal limits. All motor responses including the right median, ulnar, peroneal, and tibial nerves are within normal limits. Right tibial H reflex study is within normal limits. There is no evidence of active or chronic motor axonal loss changes affecting any of the tested muscles.  Motor unit configuration and recruitment pattern is within normal limits.  Impression: This is a normal study of the right upper and lower extremities.  In particular, there is no evidence of a large fiber sensorimotor polyneuropathy, cervical/lumbosacral radiculopathy, or carpal tunnel syndrome.   ___________________________ Tonita Blanch, DO    Nerve Conduction Studies   Stim Site NR Peak (ms) Norm Peak (ms) O-P Amp (V) Norm O-P Amp  Right Median Anti Sensory (2nd Digit)  32 C  Wrist    3.1 <3.8 35.8 >10  Right Sup Peroneal Anti Sensory (Ant Lat Mall)  32 C  12 cm    1.9 <4.6 18.1 >3  Right Sural Anti Sensory (Lat Mall)  32 C  Calf    2.4 <4.6 23.2 >3  Right Ulnar Anti Sensory (5th Digit)  32 C  Wrist    2.8 <3.2 57.3 >5     Stim Site NR Onset (ms) Norm Onset (ms) O-P Amp (mV) Norm O-P Amp Site1 Site2 Delta-0 (ms) Dist (cm) Vel (m/s) Norm Vel (m/s)  Right Median Motor (Abd Poll Brev)  32 C  Wrist    3.6 <4.0 10.2 >5 Elbow Wrist 4.4 29.0 66 >50  Elbow    8.0  10.1         Right  Peroneal Motor (Ext Dig Brev)  32 C  Ankle    3.0 <6.0 3.9 >2.5 B Fib Ankle 6.9 34.0 49 >40  B Fib    9.9  3.6  Poplt B Fib 1.6 8.0 50 >40  Poplt    11.5  3.2         Right Tibial Motor (Abd Hall Brev)  32 C  Ankle    3.2 <6.0 9.6 >4 Knee Ankle 7.3 39.0 53 >40  Knee    10.5  6.2         Right Ulnar Motor (Abd Dig Minimi)  32 C  Wrist    2.5 <3.1 13.3 >7 B Elbow Wrist 3.1 21.0 68 >50  B Elbow    5.6  12.4  A Elbow B Elbow 1.5 10.0 67 >50  A Elbow    7.1  12.2            Stim Site NR Peak (ms) Norm Peak (ms) P-T Amp (V) Site1 Site2 Delta-P (ms) Norm Delta (ms)  Right Median/Ulnar Palm Comparison (Wrist - 8cm)  32 C  Median Palm    1.5 <2.2 132.6 Median Palm Ulnar Palm 0.1   Ulnar Palm    1.4 <2.2 28.2       Electromyography   Side Muscle Ins.Act Fibs Fasc Recrt  Amp Dur Poly Activation Comment  Right 1stDorInt Nml Nml Nml Nml Nml Nml Nml Nml N/A  Right Abd Poll Brev Nml Nml Nml Nml Nml Nml Nml Nml N/A  Right PronatorTeres Nml Nml Nml Nml Nml Nml Nml Nml N/A  Right Biceps Nml Nml Nml Nml Nml Nml Nml Nml N/A  Right Triceps Nml Nml Nml Nml Nml Nml Nml Nml N/A  Right Deltoid Nml Nml Nml Nml Nml Nml Nml Nml N/A  Right AntTibialis Nml Nml Nml Nml Nml Nml Nml Nml N/A  Right Gastroc Nml Nml Nml Nml Nml Nml Nml Nml N/A  Right Flex Dig Long Nml Nml Nml Nml Nml Nml Nml Nml N/A  Right RectFemoris Nml Nml Nml Nml Nml Nml Nml Nml N/A  Right BicepsFemS Nml Nml Nml Nml Nml Nml Nml Nml N/A  Right GluteusMed Nml Nml Nml Nml Nml Nml Nml Nml N/A      Waveforms:                         "

## 2024-08-07 ENCOUNTER — Ambulatory Visit: Payer: Self-pay | Admitting: Neurology

## 2024-08-07 NOTE — Progress Notes (Signed)
 Patient advised.

## 2025-01-06 ENCOUNTER — Ambulatory Visit: Payer: Self-pay | Admitting: Neurology
